# Patient Record
Sex: Male | Born: 1935 | Race: White | Hispanic: No | Marital: Married | Smoking: Never smoker
Health system: Southern US, Community
[De-identification: ages and names within clinical notes are randomized; demographics above are authoritative.]

## PROBLEM LIST (undated history)

## (undated) DIAGNOSIS — M1990 Unspecified osteoarthritis, unspecified site: Secondary | ICD-10-CM

## (undated) DIAGNOSIS — I10 Essential (primary) hypertension: Secondary | ICD-10-CM

## (undated) DIAGNOSIS — E785 Hyperlipidemia, unspecified: Secondary | ICD-10-CM

## (undated) HISTORY — PX: APPENDECTOMY: SHX54

## (undated) HISTORY — PX: JOINT REPLACEMENT: SHX530

## (undated) HISTORY — DX: Essential (primary) hypertension: I10

## (undated) HISTORY — PX: HERNIA REPAIR: SHX51

## (undated) HISTORY — DX: Hyperlipidemia, unspecified: E78.5

---

## 1999-06-29 ENCOUNTER — Encounter: Payer: Self-pay | Admitting: Orthopedic Surgery

## 1999-07-06 ENCOUNTER — Encounter: Payer: Self-pay | Admitting: Orthopedic Surgery

## 1999-07-06 ENCOUNTER — Inpatient Hospital Stay (HOSPITAL_COMMUNITY): Admission: RE | Admit: 1999-07-06 | Discharge: 1999-07-10 | Payer: Self-pay | Admitting: Orthopedic Surgery

## 2000-05-10 ENCOUNTER — Observation Stay (HOSPITAL_COMMUNITY): Admission: RE | Admit: 2000-05-10 | Discharge: 2000-05-11 | Payer: Self-pay

## 2000-06-26 ENCOUNTER — Encounter: Payer: Self-pay | Admitting: Orthopedic Surgery

## 2000-07-03 ENCOUNTER — Encounter: Payer: Self-pay | Admitting: Orthopedic Surgery

## 2000-07-03 ENCOUNTER — Inpatient Hospital Stay (HOSPITAL_COMMUNITY): Admission: RE | Admit: 2000-07-03 | Discharge: 2000-07-07 | Payer: Self-pay | Admitting: Orthopedic Surgery

## 2005-02-02 ENCOUNTER — Ambulatory Visit: Payer: Self-pay | Admitting: Gastroenterology

## 2006-01-30 ENCOUNTER — Ambulatory Visit: Payer: Self-pay | Admitting: Gastroenterology

## 2011-05-21 DIAGNOSIS — M1990 Unspecified osteoarthritis, unspecified site: Secondary | ICD-10-CM | POA: Diagnosis not present

## 2011-06-21 DIAGNOSIS — M5137 Other intervertebral disc degeneration, lumbosacral region: Secondary | ICD-10-CM | POA: Diagnosis not present

## 2011-06-25 DIAGNOSIS — I10 Essential (primary) hypertension: Secondary | ICD-10-CM | POA: Diagnosis not present

## 2011-06-25 DIAGNOSIS — M1990 Unspecified osteoarthritis, unspecified site: Secondary | ICD-10-CM | POA: Diagnosis not present

## 2011-09-13 DIAGNOSIS — M5137 Other intervertebral disc degeneration, lumbosacral region: Secondary | ICD-10-CM | POA: Diagnosis not present

## 2011-12-19 DIAGNOSIS — L570 Actinic keratosis: Secondary | ICD-10-CM | POA: Diagnosis not present

## 2011-12-19 DIAGNOSIS — L821 Other seborrheic keratosis: Secondary | ICD-10-CM | POA: Diagnosis not present

## 2012-01-01 DIAGNOSIS — H2510 Age-related nuclear cataract, unspecified eye: Secondary | ICD-10-CM | POA: Diagnosis not present

## 2012-01-03 DIAGNOSIS — M5137 Other intervertebral disc degeneration, lumbosacral region: Secondary | ICD-10-CM | POA: Diagnosis not present

## 2012-01-09 DIAGNOSIS — E119 Type 2 diabetes mellitus without complications: Secondary | ICD-10-CM | POA: Diagnosis not present

## 2012-01-09 DIAGNOSIS — Z0000 Encounter for general adult medical examination without abnormal findings: Secondary | ICD-10-CM | POA: Diagnosis not present

## 2012-01-09 DIAGNOSIS — I10 Essential (primary) hypertension: Secondary | ICD-10-CM | POA: Diagnosis not present

## 2012-01-09 DIAGNOSIS — K219 Gastro-esophageal reflux disease without esophagitis: Secondary | ICD-10-CM | POA: Diagnosis not present

## 2012-01-09 DIAGNOSIS — E7800 Pure hypercholesterolemia, unspecified: Secondary | ICD-10-CM | POA: Diagnosis not present

## 2012-01-13 ENCOUNTER — Ambulatory Visit (INDEPENDENT_AMBULATORY_CARE_PROVIDER_SITE_OTHER): Payer: Medicare Other | Admitting: Family Medicine

## 2012-01-13 VITALS — BP 132/70 | HR 63 | Temp 97.8°F | Resp 18 | Wt 187.0 lb

## 2012-01-13 DIAGNOSIS — S0180XA Unspecified open wound of other part of head, initial encounter: Secondary | ICD-10-CM | POA: Diagnosis not present

## 2012-01-13 DIAGNOSIS — H5710 Ocular pain, unspecified eye: Secondary | ICD-10-CM

## 2012-01-13 MED ORDER — CEPHALEXIN 500 MG PO CAPS: 500.0000 mg | ORAL_CAPSULE | Freq: Two times a day (BID) | ORAL | Status: DC

## 2012-01-13 NOTE — Patient Instructions (Addendum)
Check with your doctor tomorrow as to when the last tetanus vaccine was received.  If not within the past 5 years, this needs to be given for your current injury.  Because the wound was open for a prolonged time, we are starting an antibiotic for the next week to lessen the chance of infection.  See precautions below for head injury and the wound. Return to the clinic or go to the nearest emergency room if any of your symptoms worsen or new symptoms occur.      HEAD INJURY If any of the following occur notify your physician or go to the Hospital Emergency Department: . Increased drowsiness, stupor or loss of consciousness . Restlessness or convulsions (fits) . Paralysis in arms or legs . Temperature above 100 F . Vomiting . Severe headache . Blood or clear fluid dripping from the nose or ears . Stiffness of the neck . Dizziness or blurred vision . Pulsating pain in the eye . Unequal pupils of eye . Personality changes . Any other unusual symptoms PRECAUTIONS . Keep head elevated at all times for the first 24 hours (Elevate mattress if pillow is ineffective) . Do not take tranquilizers, sedatives, narcotics or alcohol . Avoid aspirin. Use only acetaminophen (e.g. Tylenol) or ibuprofen (e.g. Advil) for relief of pain. Follow directions on the bottle for dosage. . Use ice packs for comfort Have parent, spouse, or friend check on you once tonight  MEDICATIONS Use medications only as directed by your physician  WOUND CARE Please return in 5-7 days to have your stitches/staples removed or sooner if you have concerns. Marland Kitchen Keep area clean and dry for 24 hours. Do not remove bandage, if applied. . After 24 hours, remove bandage and wash wound gently with mild soap and warm water. Reapply a new bandage after cleaning wound, if directed. . Continue daily cleansing with soap and water until stitches/staples are removed. . Do not apply any ointments or creams to the wound while  stitches/staples are in place, as this may cause delayed healing. . Notify the office if you experience any of the following signs of infection: Swelling, redness, pus drainage, streaking, fever >101.0 F . Notify the office if you experience excessive bleeding that does not stop after 15-20 minutes of constant, firm pressure.

## 2012-01-13 NOTE — Progress Notes (Signed)
Subjective:    Patient ID: Vernon Walton, male    DOB: 08/05/1918, 78 y.o.   MRN: [id number]  HPI Vernon Walton is a 78 y.o. male Reaching for phone at 8:30 or 9 pm last night.  while trying to some exercises - foot slipped on rug - hit L forehead on bedside table - wound on L forehead.  Wife saw today, advised to have checked.  No headache, no pain in area, no vision difficulty, no N/V or lightheadedness.  No blood thinners. No loc at time of injury  Tx: bandage only.    Review of Systems  Eyes: Negative for photophobia, pain and visual disturbance.  Skin: Positive for wound.  Neurological: Negative for dizziness, facial asymmetry, speech difficulty, weakness, light-headedness, numbness and headaches.       Objective:   Physical Exam  Constitutional: He appears well-developed and well-nourished.  HENT:  Head: Macrocephalic.    Right Ear: External ear normal.  Left Ear: External ear normal.  Pulmonary/Chest: Effort normal.  Skin: Skin is warm and dry.  Psychiatric: He has a normal mood and affect. His behavior is normal.       Assessment & Plan:  Vernon Walton is a 78 y.o. male  Wound - face with prolonged time to repair.  Repaired as above.  Wound care and head injury precautions reviewed and h/o given. Keflex 500mg  BID rx for 1 week.  Has appt with Dr. Renne Crigler tomorrow. He will check on tetanus status then - if has not had in 5 years - will need updated.   Rtc/wound precautions discussed. Head injury precautions discussed.   Patient Instructions  Check with your doctor tomorrow as to when the last tetanus vaccine was received.  If not within the past 5 years, this needs to be given for your current injury.  Because the wound was open for a prolonged time, we are starting an antibiotic for the next week to lessen the chance of infection.  See precautions below for head injury and the wound. Return to the clinic or go to the nearest emergency room if any of your symptoms  worsen or new symptoms occur.      HEAD INJURY If any of the following occur notify your physician or go to the Hospital Emergency Department: . Increased drowsiness, stupor or loss of consciousness . Restlessness or convulsions (fits) . Paralysis in arms or legs . Temperature above 100 F . Vomiting . Severe headache . Blood or clear fluid dripping from the nose or ears . Stiffness of the neck . Dizziness or blurred vision . Pulsating pain in the eye . Unequal pupils of eye . Personality changes . Any other unusual symptoms PRECAUTIONS . Keep head elevated at all times for the first 24 hours (Elevate mattress if pillow is ineffective) . Do not take tranquilizers, sedatives, narcotics or alcohol . Avoid aspirin. Use only acetaminophen (e.g. Tylenol) or ibuprofen (e.g. Advil) for relief of pain. Follow directions on the bottle for dosage. . Use ice packs for comfort Have parent, spouse, or friend check on you once tonight  MEDICATIONS Use medications only as directed by your physician  WOUND CARE Please return in 5-7 days to have your stitches/staples removed or sooner if you have concerns. Marland Kitchen Keep area clean and dry for 24 hours. Do not remove bandage, if applied. . After 24 hours, remove bandage and wash wound gently with mild soap and warm water. Reapply a new bandage after cleaning wound, if directed. . Continue daily  cleansing with soap and water until stitches/staples are removed. . Do not apply any ointments or creams to the wound while stitches/staples are in place, as this may cause delayed healing. . Notify the office if you experience any of the following signs of infection: Swelling, redness, pus drainage, streaking, fever >101.0 F . Notify the office if you experience excessive bleeding that does not stop after 15-20 minutes of constant, firm pressure.

## 2012-01-13 NOTE — Progress Notes (Signed)
Verbal consent obtained from the patient.  Local anesthesia with 3cc Lidocaine 2% with epinephrine.  Wound scrubbed with soap and water and rinsed.  Wound closed with 4 5-0 Ethilon simple interrupted sutures.  Wound cleansed and dressed.

## 2012-01-14 DIAGNOSIS — I10 Essential (primary) hypertension: Secondary | ICD-10-CM | POA: Diagnosis not present

## 2012-01-14 DIAGNOSIS — Z23 Encounter for immunization: Secondary | ICD-10-CM | POA: Diagnosis not present

## 2012-01-14 DIAGNOSIS — IMO0002 Reserved for concepts with insufficient information to code with codable children: Secondary | ICD-10-CM | POA: Diagnosis not present

## 2012-01-14 DIAGNOSIS — E7800 Pure hypercholesterolemia, unspecified: Secondary | ICD-10-CM | POA: Diagnosis not present

## 2012-01-14 DIAGNOSIS — Z0000 Encounter for general adult medical examination without abnormal findings: Secondary | ICD-10-CM | POA: Diagnosis not present

## 2012-01-18 ENCOUNTER — Ambulatory Visit (INDEPENDENT_AMBULATORY_CARE_PROVIDER_SITE_OTHER): Payer: Medicare Other | Admitting: Family Medicine

## 2012-01-18 VITALS — BP 132/74 | HR 57 | Temp 98.1°F | Resp 16 | Ht 64.0 in | Wt 188.0 lb

## 2012-01-18 DIAGNOSIS — S0180XA Unspecified open wound of other part of head, initial encounter: Secondary | ICD-10-CM

## 2012-01-18 NOTE — Patient Instructions (Signed)
Return prn 

## 2012-01-18 NOTE — Progress Notes (Signed)
Subjective: Patient here for stitch removal moved. No complaints.  Objective: 4 sutures removed without difficulty. Wound looks good.  Assessment: Include resolving  Plan: Return when necessary

## 2012-02-11 DIAGNOSIS — I10 Essential (primary) hypertension: Secondary | ICD-10-CM | POA: Diagnosis not present

## 2012-03-04 DIAGNOSIS — Z96659 Presence of unspecified artificial knee joint: Secondary | ICD-10-CM | POA: Diagnosis not present

## 2012-03-14 DIAGNOSIS — G894 Chronic pain syndrome: Secondary | ICD-10-CM | POA: Diagnosis not present

## 2012-04-11 DIAGNOSIS — I10 Essential (primary) hypertension: Secondary | ICD-10-CM | POA: Diagnosis not present

## 2012-04-11 DIAGNOSIS — K5900 Constipation, unspecified: Secondary | ICD-10-CM | POA: Diagnosis not present

## 2012-07-09 DIAGNOSIS — I10 Essential (primary) hypertension: Secondary | ICD-10-CM | POA: Diagnosis not present

## 2012-07-09 DIAGNOSIS — M159 Polyosteoarthritis, unspecified: Secondary | ICD-10-CM | POA: Diagnosis not present

## 2012-10-08 DIAGNOSIS — M25579 Pain in unspecified ankle and joints of unspecified foot: Secondary | ICD-10-CM | POA: Diagnosis not present

## 2013-01-05 DIAGNOSIS — M545 Low back pain: Secondary | ICD-10-CM | POA: Diagnosis not present

## 2013-01-09 DIAGNOSIS — E7800 Pure hypercholesterolemia, unspecified: Secondary | ICD-10-CM | POA: Diagnosis not present

## 2013-01-09 DIAGNOSIS — Z0000 Encounter for general adult medical examination without abnormal findings: Secondary | ICD-10-CM | POA: Diagnosis not present

## 2013-01-09 DIAGNOSIS — E119 Type 2 diabetes mellitus without complications: Secondary | ICD-10-CM | POA: Diagnosis not present

## 2013-01-09 DIAGNOSIS — I10 Essential (primary) hypertension: Secondary | ICD-10-CM | POA: Diagnosis not present

## 2013-01-14 DIAGNOSIS — Z23 Encounter for immunization: Secondary | ICD-10-CM | POA: Diagnosis not present

## 2013-01-14 DIAGNOSIS — M545 Low back pain: Secondary | ICD-10-CM | POA: Diagnosis not present

## 2013-02-11 DIAGNOSIS — E7800 Pure hypercholesterolemia, unspecified: Secondary | ICD-10-CM | POA: Diagnosis not present

## 2013-02-11 DIAGNOSIS — M4800 Spinal stenosis, site unspecified: Secondary | ICD-10-CM | POA: Diagnosis not present

## 2013-02-11 DIAGNOSIS — I10 Essential (primary) hypertension: Secondary | ICD-10-CM | POA: Diagnosis not present

## 2013-02-23 DIAGNOSIS — M545 Low back pain: Secondary | ICD-10-CM | POA: Diagnosis not present

## 2013-03-10 DIAGNOSIS — M48061 Spinal stenosis, lumbar region without neurogenic claudication: Secondary | ICD-10-CM | POA: Diagnosis not present

## 2013-03-18 DIAGNOSIS — I10 Essential (primary) hypertension: Secondary | ICD-10-CM | POA: Diagnosis not present

## 2013-04-21 DIAGNOSIS — Z79899 Other long term (current) drug therapy: Secondary | ICD-10-CM | POA: Diagnosis not present

## 2013-04-21 DIAGNOSIS — G894 Chronic pain syndrome: Secondary | ICD-10-CM | POA: Diagnosis not present

## 2013-04-21 DIAGNOSIS — M545 Low back pain: Secondary | ICD-10-CM | POA: Diagnosis not present

## 2013-07-13 DIAGNOSIS — H2510 Age-related nuclear cataract, unspecified eye: Secondary | ICD-10-CM | POA: Diagnosis not present

## 2013-08-19 DIAGNOSIS — M545 Low back pain, unspecified: Secondary | ICD-10-CM | POA: Diagnosis not present

## 2013-10-09 DIAGNOSIS — Z96659 Presence of unspecified artificial knee joint: Secondary | ICD-10-CM | POA: Diagnosis not present

## 2013-12-09 DIAGNOSIS — G894 Chronic pain syndrome: Secondary | ICD-10-CM | POA: Diagnosis not present

## 2013-12-30 DIAGNOSIS — M545 Low back pain, unspecified: Secondary | ICD-10-CM | POA: Diagnosis not present

## 2014-01-15 DIAGNOSIS — G894 Chronic pain syndrome: Secondary | ICD-10-CM | POA: Diagnosis not present

## 2014-01-15 DIAGNOSIS — Z79899 Other long term (current) drug therapy: Secondary | ICD-10-CM | POA: Diagnosis not present

## 2014-01-27 DIAGNOSIS — L821 Other seborrheic keratosis: Secondary | ICD-10-CM | POA: Diagnosis not present

## 2014-01-27 DIAGNOSIS — L570 Actinic keratosis: Secondary | ICD-10-CM | POA: Diagnosis not present

## 2014-02-08 DIAGNOSIS — I10 Essential (primary) hypertension: Secondary | ICD-10-CM | POA: Diagnosis not present

## 2014-02-08 DIAGNOSIS — Z23 Encounter for immunization: Secondary | ICD-10-CM | POA: Diagnosis not present

## 2014-02-08 DIAGNOSIS — E119 Type 2 diabetes mellitus without complications: Secondary | ICD-10-CM | POA: Diagnosis not present

## 2014-02-08 DIAGNOSIS — Z0000 Encounter for general adult medical examination without abnormal findings: Secondary | ICD-10-CM | POA: Diagnosis not present

## 2014-02-08 DIAGNOSIS — E780 Pure hypercholesterolemia: Secondary | ICD-10-CM | POA: Diagnosis not present

## 2014-02-11 DIAGNOSIS — B351 Tinea unguium: Secondary | ICD-10-CM | POA: Diagnosis not present

## 2014-02-11 DIAGNOSIS — G629 Polyneuropathy, unspecified: Secondary | ICD-10-CM | POA: Diagnosis not present

## 2014-02-11 DIAGNOSIS — L84 Corns and callosities: Secondary | ICD-10-CM | POA: Diagnosis not present

## 2014-02-11 DIAGNOSIS — D519 Vitamin B12 deficiency anemia, unspecified: Secondary | ICD-10-CM | POA: Diagnosis not present

## 2014-02-23 ENCOUNTER — Ambulatory Visit (INDEPENDENT_AMBULATORY_CARE_PROVIDER_SITE_OTHER): Payer: Medicare Other

## 2014-02-23 VITALS — BP 143/69 | HR 62 | Resp 15 | Ht 65.0 in | Wt 173.0 lb

## 2014-02-23 DIAGNOSIS — L84 Corns and callosities: Secondary | ICD-10-CM | POA: Diagnosis not present

## 2014-02-23 DIAGNOSIS — M19071 Primary osteoarthritis, right ankle and foot: Secondary | ICD-10-CM

## 2014-02-23 DIAGNOSIS — M2041 Other hammer toe(s) (acquired), right foot: Secondary | ICD-10-CM | POA: Diagnosis not present

## 2014-02-23 DIAGNOSIS — R269 Unspecified abnormalities of gait and mobility: Secondary | ICD-10-CM

## 2014-02-23 DIAGNOSIS — Q828 Other specified congenital malformations of skin: Secondary | ICD-10-CM | POA: Diagnosis not present

## 2014-02-23 DIAGNOSIS — M2031 Hallux varus (acquired), right foot: Secondary | ICD-10-CM

## 2014-02-23 NOTE — Patient Instructions (Signed)
Corns and Calluses Corns are small areas of thickened skin that usually occur on the top, sides, or tip of a toe. They contain a cone-shaped core with a point that can press on a nerve below. This causes pain. Calluses are areas of thickened skin that usually develop on hands, fingers, palms, soles of the feet, and heels. These are areas that experience frequent friction or pressure. CAUSES  Corns are usually the result of rubbing (friction) or pressure from shoes that are too tight or do not fit properly. Calluses are caused by repeated friction and pressure on the affected areas. SYMPTOMS  A hard growth on the skin.  Pain or tenderness under the skin.  Sometimes, redness and swelling.  Increased discomfort while wearing tight-fitting shoes. DIAGNOSIS  Your caregiver can usually tell what the problem is by doing a physical exam. TREATMENT  Removing the cause of the friction or pressure is usually the only treatment needed. However, sometimes medicines can be used to help soften the hardened, thickened areas. These medicines include salicylic acid plasters and 12% ammonium lactate lotion. These medicines should only be used under the direction of your caregiver. HOME CARE INSTRUCTIONS   Try to remove pressure from the affected area.  You may wear donut-shaped corn pads to protect your skin.  You may use a pumice stone or nonmetallic nail file to gently reduce the thickness of a corn.  Wear properly fitted footwear.  If you have calluses on the hands, wear gloves during activities that cause friction.  If you have diabetes, you should regularly examine your feet. Tell your caregiver if you notice any problems with your feet. SEEK IMMEDIATE MEDICAL CARE IF:   You have increased pain, swelling, redness, or warmth in the affected area.  Your corn or callus starts to drain fluid or bleeds.  You are not getting better, even with treatment. Document Released: 01/14/2004 Document  Revised: 07/02/2011 Document Reviewed: 12/05/2010 [employer] Patient Information 2015 [employer]. This information is not intended to replace advice given to you by your health care provider. Make sure you discuss any questions you have with your health care provider.  

## 2014-02-23 NOTE — Progress Notes (Signed)
   Subjective:    Patient ID: Vernon HawkingARMAND [name] Sr., male    DOB: 20-Dec-1918, 80 y.o.   MRN: [id number]  HPI Comments: N corn L right 1st toe tip D and O years C hard painful A walking and shoe wear T referred by Dr. Renne CriglerPharr     Review of Systems  HENT: Positive for hearing loss.   All other systems reviewed and are negative.      Objective:   Physical Exam 80 year old white male well-developed well-nourished oriented 3 presents this time with a complaint of the lesion distal tip of his right hallux. 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 Lower extremity objective findings reveal vascular status to be intact with pedal pulses palpable DP and PT plus one over 4 bilateral capillary refill time 3 seconds epicritic and proprioceptive sensations appear to be intact and symmetric bilateral there is normal plantar response DTRs not elicited dermatologically skin color pigment normal hair growth absent nails criptotic friable brittle dystrophic and tender both on palpation and with enclosed shoe wear patient also has a single keratotic lesion distal plantar tuft of the right hallux due to hallux malleus deformity. No open wounds no ulcers no secondary infections no x-rays taken at this time patient is to generalized weakness and stiffness in both ankles to his walking especially on uneven surface or train at times. He did request an ankle brace of some sort he's tried over-the-counter braces which have been unsuccessful looking for some abnormal more of a rigid or stable brace may looking to see if we get with gait stabilizer brace or Richie type prefab brace to help stabilize ankles and stabilizes on steady gait.       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 Patient is been wearing a thin comfort high top boot wears a size 11 shoe significant distress other than ankle instability and keratotic lesion which really may require future palliative debridement next  Alvan Dameichard Zacharia Sowles DPM

## 2014-03-04 ENCOUNTER — Ambulatory Visit (INDEPENDENT_AMBULATORY_CARE_PROVIDER_SITE_OTHER): Payer: Medicare Other

## 2014-03-04 ENCOUNTER — Telehealth: Payer: Self-pay | Admitting: *Deleted

## 2014-03-04 DIAGNOSIS — R269 Unspecified abnormalities of gait and mobility: Secondary | ICD-10-CM

## 2014-03-04 NOTE — Telephone Encounter (Signed)
I called yesterday regarding the ankle support Dr. Ralene CorkSikora was going to have for me.  I'd appreciate you getting back to me and let me know if it's ready.  Thank you.  [facility] and [facility] said the brace was here. She said it was okay to put him on the nurse's schedule to pick it up. I called and informed the patient.  He asked, "Can I come in today?"  I told him I would have to transfer him to a scheduler.

## 2014-03-04 NOTE — Telephone Encounter (Signed)
Good Morning, I'm still calling about the ankle wrap.  I haven't heard from you.  This will be my last call.  I won't call you anymore because it's just a waste of time on my part.  Thank you.  Already taken care of today earlier.

## 2014-03-05 NOTE — Addendum Note (Signed)
Addended by: Lottie RaterPREVETTE, Emmalou Hunger E on: 03/05/2014 02:17 PM   Modules accepted: Level of Service

## 2014-04-22 DIAGNOSIS — M47816 Spondylosis without myelopathy or radiculopathy, lumbar region: Secondary | ICD-10-CM | POA: Diagnosis not present

## 2014-06-23 DIAGNOSIS — M5416 Radiculopathy, lumbar region: Secondary | ICD-10-CM | POA: Diagnosis not present

## 2014-07-20 DIAGNOSIS — H2513 Age-related nuclear cataract, bilateral: Secondary | ICD-10-CM | POA: Diagnosis not present

## 2014-07-30 DIAGNOSIS — Z79891 Long term (current) use of opiate analgesic: Secondary | ICD-10-CM | POA: Diagnosis not present

## 2014-07-30 DIAGNOSIS — M5416 Radiculopathy, lumbar region: Secondary | ICD-10-CM | POA: Diagnosis not present

## 2014-07-30 DIAGNOSIS — G894 Chronic pain syndrome: Secondary | ICD-10-CM | POA: Diagnosis not present

## 2014-08-25 DIAGNOSIS — M5416 Radiculopathy, lumbar region: Secondary | ICD-10-CM | POA: Diagnosis not present

## 2014-11-25 DIAGNOSIS — Z79891 Long term (current) use of opiate analgesic: Secondary | ICD-10-CM | POA: Diagnosis not present

## 2014-11-25 DIAGNOSIS — M5416 Radiculopathy, lumbar region: Secondary | ICD-10-CM | POA: Diagnosis not present

## 2014-11-25 DIAGNOSIS — G894 Chronic pain syndrome: Secondary | ICD-10-CM | POA: Diagnosis not present

## 2014-12-22 DIAGNOSIS — M5416 Radiculopathy, lumbar region: Secondary | ICD-10-CM | POA: Diagnosis not present

## 2015-01-17 DIAGNOSIS — G894 Chronic pain syndrome: Secondary | ICD-10-CM | POA: Diagnosis not present

## 2015-01-20 DIAGNOSIS — G894 Chronic pain syndrome: Secondary | ICD-10-CM | POA: Diagnosis not present

## 2015-01-20 DIAGNOSIS — M5416 Radiculopathy, lumbar region: Secondary | ICD-10-CM | POA: Diagnosis not present

## 2015-01-20 DIAGNOSIS — Z79891 Long term (current) use of opiate analgesic: Secondary | ICD-10-CM | POA: Diagnosis not present

## 2015-02-16 DIAGNOSIS — Z23 Encounter for immunization: Secondary | ICD-10-CM | POA: Diagnosis not present

## 2015-03-24 DIAGNOSIS — L570 Actinic keratosis: Secondary | ICD-10-CM | POA: Diagnosis not present

## 2015-04-14 DIAGNOSIS — M5416 Radiculopathy, lumbar region: Secondary | ICD-10-CM | POA: Diagnosis not present

## 2015-04-14 DIAGNOSIS — Z79891 Long term (current) use of opiate analgesic: Secondary | ICD-10-CM | POA: Diagnosis not present

## 2015-07-01 DIAGNOSIS — K219 Gastro-esophageal reflux disease without esophagitis: Secondary | ICD-10-CM | POA: Diagnosis not present

## 2015-07-01 DIAGNOSIS — I10 Essential (primary) hypertension: Secondary | ICD-10-CM | POA: Diagnosis not present

## 2015-07-07 DIAGNOSIS — Z0000 Encounter for general adult medical examination without abnormal findings: Secondary | ICD-10-CM | POA: Diagnosis not present

## 2015-07-14 DIAGNOSIS — I10 Essential (primary) hypertension: Secondary | ICD-10-CM | POA: Diagnosis not present

## 2015-07-14 DIAGNOSIS — K219 Gastro-esophageal reflux disease without esophagitis: Secondary | ICD-10-CM | POA: Diagnosis not present

## 2015-07-14 DIAGNOSIS — N400 Enlarged prostate without lower urinary tract symptoms: Secondary | ICD-10-CM | POA: Diagnosis not present

## 2015-07-14 DIAGNOSIS — M159 Polyosteoarthritis, unspecified: Secondary | ICD-10-CM | POA: Diagnosis not present

## 2015-07-20 DIAGNOSIS — H2513 Age-related nuclear cataract, bilateral: Secondary | ICD-10-CM | POA: Diagnosis not present

## 2015-07-20 DIAGNOSIS — Z0100 Encounter for examination of eyes and vision without abnormal findings: Secondary | ICD-10-CM | POA: Diagnosis not present

## 2015-07-20 DIAGNOSIS — H25013 Cortical age-related cataract, bilateral: Secondary | ICD-10-CM | POA: Diagnosis not present

## 2015-08-11 DIAGNOSIS — M5416 Radiculopathy, lumbar region: Secondary | ICD-10-CM | POA: Diagnosis not present

## 2015-08-11 DIAGNOSIS — Z79891 Long term (current) use of opiate analgesic: Secondary | ICD-10-CM | POA: Diagnosis not present

## 2015-08-11 DIAGNOSIS — G894 Chronic pain syndrome: Secondary | ICD-10-CM | POA: Diagnosis not present

## 2016-01-06 DIAGNOSIS — M5416 Radiculopathy, lumbar region: Secondary | ICD-10-CM | POA: Diagnosis not present

## 2016-01-06 DIAGNOSIS — G894 Chronic pain syndrome: Secondary | ICD-10-CM | POA: Diagnosis not present

## 2016-01-06 DIAGNOSIS — Z79891 Long term (current) use of opiate analgesic: Secondary | ICD-10-CM | POA: Diagnosis not present

## 2016-02-28 DIAGNOSIS — E119 Type 2 diabetes mellitus without complications: Secondary | ICD-10-CM | POA: Diagnosis not present

## 2016-02-28 DIAGNOSIS — Z23 Encounter for immunization: Secondary | ICD-10-CM | POA: Diagnosis not present

## 2016-07-12 DIAGNOSIS — G894 Chronic pain syndrome: Secondary | ICD-10-CM | POA: Diagnosis not present

## 2016-07-12 DIAGNOSIS — Z79891 Long term (current) use of opiate analgesic: Secondary | ICD-10-CM | POA: Diagnosis not present

## 2016-07-12 DIAGNOSIS — M5416 Radiculopathy, lumbar region: Secondary | ICD-10-CM | POA: Diagnosis not present

## 2016-07-25 DIAGNOSIS — H52203 Unspecified astigmatism, bilateral: Secondary | ICD-10-CM | POA: Diagnosis not present

## 2016-07-25 DIAGNOSIS — H5203 Hypermetropia, bilateral: Secondary | ICD-10-CM | POA: Diagnosis not present

## 2016-07-25 DIAGNOSIS — H2513 Age-related nuclear cataract, bilateral: Secondary | ICD-10-CM | POA: Diagnosis not present

## 2016-08-10 DIAGNOSIS — M19041 Primary osteoarthritis, right hand: Secondary | ICD-10-CM | POA: Diagnosis not present

## 2016-08-10 DIAGNOSIS — M19042 Primary osteoarthritis, left hand: Secondary | ICD-10-CM | POA: Diagnosis not present

## 2016-08-28 DIAGNOSIS — M545 Low back pain: Secondary | ICD-10-CM | POA: Diagnosis not present

## 2016-08-28 DIAGNOSIS — E119 Type 2 diabetes mellitus without complications: Secondary | ICD-10-CM | POA: Diagnosis not present

## 2016-08-28 DIAGNOSIS — G8929 Other chronic pain: Secondary | ICD-10-CM | POA: Diagnosis not present

## 2016-09-01 ENCOUNTER — Encounter (HOSPITAL_COMMUNITY): Payer: Self-pay | Admitting: Emergency Medicine

## 2016-09-01 ENCOUNTER — Inpatient Hospital Stay (HOSPITAL_COMMUNITY)
Admission: EM | Admit: 2016-09-01 | Discharge: 2016-09-03 | DRG: 310 | Disposition: A | Discharge status: Home / Self Care | Payer: Medicare Other | Attending: Internal Medicine | Admitting: Internal Medicine

## 2016-09-01 ENCOUNTER — Emergency Department (HOSPITAL_COMMUNITY): Payer: Medicare Other

## 2016-09-01 DIAGNOSIS — Z96653 Presence of artificial knee joint, bilateral: Secondary | ICD-10-CM | POA: Diagnosis not present

## 2016-09-01 DIAGNOSIS — Z79899 Other long term (current) drug therapy: Secondary | ICD-10-CM

## 2016-09-01 DIAGNOSIS — D649 Anemia, unspecified: Secondary | ICD-10-CM | POA: Diagnosis not present

## 2016-09-01 DIAGNOSIS — R001 Bradycardia, unspecified: Secondary | ICD-10-CM | POA: Diagnosis present

## 2016-09-01 DIAGNOSIS — I440 Atrioventricular block, first degree: Principal | ICD-10-CM | POA: Diagnosis present

## 2016-09-01 DIAGNOSIS — R404 Transient alteration of awareness: Secondary | ICD-10-CM | POA: Diagnosis not present

## 2016-09-01 DIAGNOSIS — S0003XA Contusion of scalp, initial encounter: Secondary | ICD-10-CM | POA: Diagnosis not present

## 2016-09-01 DIAGNOSIS — R55 Syncope and collapse: Secondary | ICD-10-CM | POA: Diagnosis not present

## 2016-09-01 DIAGNOSIS — I10 Essential (primary) hypertension: Secondary | ICD-10-CM | POA: Diagnosis present

## 2016-09-01 DIAGNOSIS — E785 Hyperlipidemia, unspecified: Secondary | ICD-10-CM | POA: Diagnosis not present

## 2016-09-01 DIAGNOSIS — S0083XA Contusion of other part of head, initial encounter: Secondary | ICD-10-CM

## 2016-09-01 HISTORY — DX: Unspecified osteoarthritis, unspecified site: M19.90

## 2016-09-01 LAB — CBC
HCT: 34.3 % — ABNORMAL LOW (ref 39.0–52.0)
Hemoglobin: 11.2 g/dL — ABNORMAL LOW (ref 13.0–17.0)
MCH: 32 pg (ref 26.0–34.0)
MCHC: 32.7 g/dL (ref 30.0–36.0)
MCV: 98 fL (ref 78.0–100.0)
Platelets: 178 10*3/uL (ref 150–400)
RBC: 3.5 MIL/uL — ABNORMAL LOW (ref 4.22–5.81)
RDW: 12.5 % (ref 11.5–15.5)
WBC: 6.5 10*3/uL (ref 4.0–10.5)

## 2016-09-01 LAB — BASIC METABOLIC PANEL
Anion gap: 7 (ref 5–15)
BUN: 31 mg/dL — AB (ref 6–20)
CO2: 27 mmol/L (ref 22–32)
Calcium: 9.6 mg/dL (ref 8.9–10.3)
Chloride: 104 mmol/L (ref 101–111)
Creatinine, Ser: 1.1 mg/dL (ref 0.61–1.24)
GFR calc Af Amer: >60 mL/min (ref >60)
GFR, EST NON AFRICAN AMERICAN: 55 mL/min — AB (ref >60)
Glucose, Bld: 106 mg/dL — ABNORMAL HIGH (ref 65–99)
POTASSIUM: 4 mmol/L (ref 3.5–5.1)
SODIUM: 138 mmol/L (ref 135–145)

## 2016-09-01 LAB — URINALYSIS, ROUTINE W REFLEX MICROSCOPIC
BILIRUBIN URINE: NEGATIVE
Glucose, UA: NEGATIVE mg/dL
Hgb urine dipstick: NEGATIVE
Ketones, ur: NEGATIVE mg/dL
LEUKOCYTES UA: NEGATIVE
NITRITE: NEGATIVE
PH: 6 (ref 5.0–8.0)
Protein, ur: NEGATIVE mg/dL
SPECIFIC GRAVITY, URINE: 1.012 (ref 1.005–1.030)

## 2016-09-01 LAB — IRON AND TIBC
IRON: 89 ug/dL (ref 45–182)
SATURATION RATIOS: 29 % (ref 17.9–39.5)
TIBC: 304 ug/dL (ref 250–450)
UIBC: 215 ug/dL

## 2016-09-01 LAB — I-STAT TROPONIN, ED: TROPONIN I, POC: 0.01 ng/mL (ref 0.00–0.08)

## 2016-09-01 LAB — RETICULOCYTES
RBC.: 3.73 MIL/uL — ABNORMAL LOW (ref 4.22–5.81)
RETIC CT PCT: 1.3 % (ref 0.4–3.1)
Retic Count, Absolute: 48.5 10*3/uL (ref 19.0–186.0)

## 2016-09-01 LAB — DIFFERENTIAL
BASOS ABS: 0.1 10*3/uL (ref 0.0–0.1)
Basophils Relative: 1 %
Eosinophils Absolute: 0.2 10*3/uL (ref 0.0–0.7)
Eosinophils Relative: 3 %
LYMPHS ABS: 1.3 10*3/uL (ref 0.7–4.0)
LYMPHS PCT: 20 %
MONO ABS: 0.6 10*3/uL (ref 0.1–1.0)
MONOS PCT: 9 %
NEUTROS ABS: 4.4 10*3/uL (ref 1.7–7.7)
Neutrophils Relative %: 67 %

## 2016-09-01 LAB — FERRITIN: Ferritin: 131 ng/mL (ref 24–336)

## 2016-09-01 LAB — TROPONIN I

## 2016-09-01 LAB — VITAMIN B12: Vitamin B-12: 3251 pg/mL — ABNORMAL HIGH (ref 180–914)

## 2016-09-01 LAB — CBG MONITORING, ED: Glucose-Capillary: 93 mg/dL (ref 65–99)

## 2016-09-01 LAB — TSH: TSH: 1.926 u[IU]/mL (ref 0.350–4.500)

## 2016-09-01 LAB — FOLATE: FOLATE: 39.6 ng/mL (ref >5.9)

## 2016-09-01 MED ORDER — PANTOPRAZOLE SODIUM 40 MG PO TBEC
40.0000 mg | DELAYED_RELEASE_TABLET | Freq: Every day | ORAL | Status: DC
  Administered 2016-09-01 – 2016-09-03 (×3): 40 mg via ORAL
  Filled 2016-09-01 (×3): qty 1

## 2016-09-01 MED ORDER — ACETAMINOPHEN 650 MG RE SUPP: 650.0000 mg | Freq: Four times a day (QID) | RECTAL | Status: DC | PRN

## 2016-09-01 MED ORDER — SODIUM CHLORIDE 0.9% FLUSH
3.0000 mL | Freq: Two times a day (BID) | INTRAVENOUS | Status: DC
  Administered 2016-09-01 – 2016-09-03 (×3): 3 mL via INTRAVENOUS

## 2016-09-01 MED ORDER — ACETAMINOPHEN 325 MG PO TABS: 650.0000 mg | ORAL_TABLET | Freq: Four times a day (QID) | ORAL | Status: DC | PRN

## 2016-09-01 MED ORDER — ENOXAPARIN SODIUM 40 MG/0.4ML ~~LOC~~ SOLN
40.0000 mg | SUBCUTANEOUS | Status: DC
  Administered 2016-09-01 – 2016-09-03 (×3): 40 mg via SUBCUTANEOUS
  Filled 2016-09-01 (×3): qty 0.4

## 2016-09-01 MED ORDER — SODIUM CHLORIDE 0.9 % IV BOLUS (SEPSIS)
1000.0000 mL | Freq: Once | INTRAVENOUS | Status: AC
  Administered 2016-09-01: 1000 mL via INTRAVENOUS

## 2016-09-01 MED ORDER — IRBESARTAN 300 MG PO TABS
300.0000 mg | ORAL_TABLET | Freq: Every day | ORAL | Status: DC
  Administered 2016-09-01 – 2016-09-03 (×3): 300 mg via ORAL
  Filled 2016-09-01 (×3): qty 1

## 2016-09-01 MED ORDER — ATORVASTATIN CALCIUM 40 MG PO TABS
40.0000 mg | ORAL_TABLET | Freq: Every day | ORAL | Status: DC
  Administered 2016-09-01 – 2016-09-02 (×2): 40 mg via ORAL
  Filled 2016-09-01 (×2): qty 1

## 2016-09-01 MED ORDER — TRAMADOL HCL 50 MG PO TABS
50.0000 mg | ORAL_TABLET | Freq: Four times a day (QID) | ORAL | Status: DC | PRN
  Administered 2016-09-01 – 2016-09-02 (×4): 50 mg via ORAL
  Filled 2016-09-01 (×4): qty 1

## 2016-09-01 NOTE — ED Notes (Signed)
Pt to ct 

## 2016-09-01 NOTE — ED Triage Notes (Signed)
Per [facility], pt from home, pt c/o dizziness after getting up from bed, experienced a syncopal episode unwitnessed. Fell to the left side. Pt is AAOX4. 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 Pt not on blood thinner. Pt also c/o dizziness and fall Thursday.

## 2016-09-01 NOTE — Discharge Instructions (Signed)
Please follow-up with your PCP as scheduled on Thursday.  Your blood work is reassuring, and your do not have serious head or neck injury on your CT scans.  We recommended that you stay in the hospital for monitoring for serious causes of you passing out but you requested to go home.  Please return without fail for worsening symptoms, including recurrent passing out, chest pain, difficulty breathing, confusion or any other symptoms concerning to you.

## 2016-09-01 NOTE — ED Provider Notes (Addendum)
[facility]-EMERGENCY DEPT Provider Note   CSN: [id number] Arrival date & time: 09/01/16  4098     History   Chief Complaint Chief Complaint  Patient presents with  . Fall  . Loss of Consciousness  . Dizziness    HPI Vernon Hawking MEO Sr. is a 83 y.o. male.  HPI 83 year old male who presents with syncope and fall. History of HTN, and HLD. No blood thinners. Got up to use restroom in early morning, felt dizzy, blacked out for few seconds and fell hitting head. Able to get up since then and feels back to baseline. Did not have dinner last night.  Denies chest pain or difficulty breathing. No abdominal pain, back pain, recent GI bleed history, n/v/d, cough. No neck pain. No new injuries other than hematoma over left forehead  Past Medical History:  Diagnosis Date  . Arthritis   . Hyperlipidemia   . Hypertension     There are no active problems to display for this patient.   Past Surgical History:  Procedure Laterality Date  . APPENDECTOMY    . HERNIA REPAIR    . JOINT REPLACEMENT Bilateral    knee        Home Medications    Prior to Admission medications   Medication Sig Start Date End Date Taking? Authorizing Provider  amLODipine (NORVASC) 5 MG tablet Take 5 mg by mouth daily.   Yes [provider]  atorvastatin (LIPITOR) 40 MG tablet Take 40 mg by mouth daily.    Yes [provider]  nebivolol (BYSTOLIC) 10 MG tablet Take 10 mg by mouth daily.   Yes [provider]  omeprazole (PRILOSEC) 20 MG capsule Take 20 mg by mouth daily.   Yes [provider]  sertraline (ZOLOFT) 50 MG tablet Take 50 mg by mouth daily.   Yes [provider]  traMADol (ULTRAM) 50 MG tablet Take 50 mg by mouth every 6 (six) hours as needed for moderate pain.  08/24/16  Yes [provider]  valsartan-hydrochlorothiazide (DIOVAN-HCT) 320-25 MG tablet Take 1 tablet by mouth daily.   Yes [provider]  cephALEXin (KEFLEX) 500 MG capsule Take 1  capsule (500 mg total) by mouth 2 (two) times daily. Patient not taking: Reported on 09/01/2016 01/13/12   Shade Flood, MD    Family History No family history on file.  Social History Social History  Substance Use Topics  . Smoking status: Never Smoker  . Smokeless tobacco: Not on file  . Alcohol use Not on file     Allergies   Patient has no known allergies.   Review of Systems Review of Systems  Constitutional: Negative for fever.  Eyes: Negative for visual disturbance.  Respiratory: Negative for shortness of breath.   Cardiovascular: Negative for chest pain and leg swelling.  Gastrointestinal: Negative for abdominal pain.  Musculoskeletal: Negative for back pain.  Allergic/Immunologic: Negative for immunocompromised state.  Neurological: Negative for weakness, numbness and headaches.  Hematological: Does not bruise/bleed easily.  Psychiatric/Behavioral: Negative for confusion.  All other systems reviewed and are negative.    Physical Exam Updated Vital Signs BP (!) 114/58   Pulse (!) 52   Temp 97.9 F (36.6 C)   Resp 18   SpO2 100%   Physical Exam Physical Exam  Nursing note and vitals reviewed. Constitutional: Well developed, well nourished, non-toxic, and in no acute distress Head: Normocephalic and atraumatic.  Mouth/Throat: Oropharynx is clear and moist.  Neck: Normal range of motion. Neck  supple. no cervical spine tenderness Cardiovascular: Normal rate and regular rhythm.   Pulmonary/Chest: Effort normal and breath sounds normal.  Abdominal: Soft. There is no tenderness. There is no rebound and no guarding.  Musculoskeletal: Normal range of motion.  Neurological: Alert, no facial droop, fluent speech, moves all extremities symmetrically, PERRL Skin: Skin is warm and dry.  Psychiatric: Cooperative   ED Treatments / Results  Labs (all labs ordered are listed, but only abnormal results are displayed) Labs Reviewed  BASIC METABOLIC PANEL -  Abnormal; Notable for the following:       Result Value   Glucose, Bld 106 (*)    BUN 31 (*)    GFR calc non Af Amer 55 (*)    All other components within normal limits  CBC - Abnormal; Notable for the following:    RBC 3.50 (*)    Hemoglobin 11.2 (*)    HCT 34.3 (*)    All other components within normal limits  DIFFERENTIAL  URINALYSIS, ROUTINE W REFLEX MICROSCOPIC  CBG MONITORING, ED  I-STAT TROPOININ, ED    EKG  EKG Interpretation  Date/Time:  Saturday Sep 01 2016 08:38:38 EDT Ventricular Rate:  53 PR Interval:    QRS Duration: 102 QT Interval:  458 QTC Calculation: 430 R Axis:   12 Text Interpretation:  Sinus rhythm Ventricular premature complex Low voltage, precordial leads Minimal ST depression, inferior leads Baseline wander in lead(s) II V1 borderline first degree AV block with PVCs Confirmed by Uva Runkel MD, Annabelle Harman ([id number]) on 09/01/2016 9:18:04 AM       Radiology Ct Head Wo Contrast  Result Date: 09/01/2016 CLINICAL DATA:  Larey Seat to left side, hematoma to left upper frontal side EXAM: CT HEAD WITHOUT CONTRAST CT CERVICAL SPINE WITHOUT CONTRAST TECHNIQUE: Multidetector CT imaging of the head and cervical spine was performed following the standard protocol without intravenous contrast. Multiplanar CT image reconstructions of the cervical spine were also generated. COMPARISON:  None. FINDINGS: CT HEAD FINDINGS Brain: There is generalized parenchymal atrophy with commensurate dilatation of the ventricles and sulci. There is no mass, hemorrhage, edema or other evidence of acute parenchymal abnormality. No extra-axial hemorrhage. Vascular: There are chronic calcified atherosclerotic changes of the large vessels at the skull base. No unexpected hyperdense vessel. Skull: Normal. Negative for fracture or focal lesion. Sinuses/Orbits: No acute finding. Other: Soft tissue edema/ hematoma overlying the lower left frontal bone. No underlying fracture. CT CERVICAL SPINE FINDINGS Alignment: Slight  reversal of the normal cervical lordosis which is likely related to the underlying degenerative changes. Mild dextroscoliosis. No evidence of acute vertebral body subluxation. Skull base and vertebrae: No fracture line or displaced fracture fragment identified. Posterior facets appear intact and normally aligned throughout. Soft tissues and spinal canal: No prevertebral fluid or swelling. No visible canal hematoma. Disc levels: Degenerative changes throughout the cervical spine, moderate to severe in degree, with associated osseous spurring and disc space narrowing. 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 There are mild to moderate central canal stenosis at the remainder the levels. Additional degenerative hypertrophy of the uncovertebral and facet joints is causing moderate or severe neural foramen stenoses at the C3-4 through C6-7 levels with probable associated nerve root impingement. Upper chest: No acute findings. Other: Heavy atherosclerosis of the carotid arteries bilaterally. Extensive degenerative change at the right shoulder, incompletely imaged. IMPRESSION: 1. Soft tissue edema/hematoma overlying the lower left frontal  bone. No underlying fracture. 2. No acute intracranial abnormality. No intracranial hemorrhage or edema. 3. No fracture or acute subluxation identified within cervical spine. 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 4. Carotid atherosclerosis. Electronically Signed   By: Bary Richard M.D.   On: 09/01/2016 10:03   Ct Cervical Spine Wo Contrast  Result Date: 09/01/2016 CLINICAL DATA:  Larey Seat to left side, hematoma to left upper frontal side EXAM: CT HEAD WITHOUT CONTRAST CT CERVICAL SPINE WITHOUT CONTRAST TECHNIQUE: Multidetector CT imaging of the head and cervical spine was performed following the standard  protocol without intravenous contrast. Multiplanar CT image reconstructions of the cervical spine were also generated. COMPARISON:  None. FINDINGS: CT HEAD FINDINGS Brain: There is generalized parenchymal atrophy with commensurate dilatation of the ventricles and sulci. There is no mass, hemorrhage, edema or other evidence of acute parenchymal abnormality. No extra-axial hemorrhage. Vascular: There are chronic calcified atherosclerotic changes of the large vessels at the skull base. No unexpected hyperdense vessel. Skull: Normal. Negative for fracture or focal lesion. Sinuses/Orbits: No acute finding. Other: Soft tissue edema/ hematoma overlying the lower left frontal bone. No underlying fracture. CT CERVICAL SPINE FINDINGS Alignment: Slight reversal of the normal cervical lordosis which is likely related to the underlying degenerative changes. Mild dextroscoliosis. No evidence of acute vertebral body subluxation. Skull base and vertebrae: No fracture line or displaced fracture fragment identified. Posterior facets appear intact and normally aligned throughout. Soft tissues and spinal canal: No prevertebral fluid or swelling. No visible canal hematoma. Disc levels: Degenerative changes throughout the cervical spine, moderate to severe in degree, with associated osseous spurring and disc space narrowing. 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 There are mild to moderate central canal stenosis at the remainder the levels. Additional degenerative hypertrophy of the uncovertebral and facet joints is causing moderate or severe neural foramen stenoses at the C3-4 through C6-7 levels with probable associated nerve root impingement. Upper chest: No acute findings. Other: Heavy atherosclerosis of the carotid arteries bilaterally. Extensive degenerative change at the right  shoulder, incompletely imaged. IMPRESSION: 1. Soft tissue edema/hematoma overlying the lower left frontal bone. No underlying fracture. 2. No acute intracranial abnormality. No intracranial hemorrhage or edema. 3. No fracture or acute subluxation identified within cervical spine. 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 4. Carotid atherosclerosis. Electronically Signed   By: Bary Richard M.D.   On: 09/01/2016 10:03    Procedures Procedures (including critical care time)  Medications Ordered in ED Medications - No data to display   Initial Impression / Assessment and Plan / ED Course  I have reviewed the triage vital signs and the nursing notes.  Pertinent labs & imaging results that were available during my care of the patient were reviewed by me and considered in my medical decision making (see chart for details).    83 year old male who presents with syncope and fall. He is nontoxic in no acute distress on my evaluation. With left forehead hematoma, but without neurological deficits and is mentating normally. CT head and cervical spine visualized and does not show acute traumatic injuries of the head and neck. No other injuries reported by history or on exam.   In regards to his syncopal episode, his EKG does shows bradycardia with borderline first-degree AV block and PVCs. Blood work was reassuring. With standing, does feel dizzy and there may  be a component of orthostasis. Given age, EKG, will plan to admit for syncope evaluation.   Final Clinical Impressions(s) / ED Diagnoses   Final diagnoses:  Syncope and collapse  Traumatic hematoma of forehead, initial encounter    New Prescriptions New Prescriptions   No medications on file     Lavera GuiseLiu, Linken Mcglothen Duo, MD 09/01/16 1025    Lavera GuiseLiu, Magie Ciampa Duo, MD 09/01/16 1045

## 2016-09-01 NOTE — H&P (Signed)
History and Physical    Vernon YANDOW Sr. NWG:[id number] DOB: 15-Jun-1918 DOA: 09/01/2016   PCP: Merri Brunette, MD   Patient coming from/Resides with: Private residence/wife  Admission status: Observation/telemetry -it may be medically necessary to stay a minimum 2 midnights to rule out impending and/or unexpected changes in physiologic status that may differ from initial evaluation performed in the ER and/or at time of admission-sitter reevaluation of admission status 24 hours.   Chief Complaint: Syncope  HPI: Vernon Hawking MEO Sr. is a 83 y.o. male with medical history significant for hypertension, dyslipidemia and arthritis. Patient reports was ambulating when became dizzy and "passed out". He quickly awakened without any bowel or bladder incontinence or postictal phase. He denied chest pain or shortness of breath prior to onset of the event. CT had an ER unremarkable. Patient was noted to have sinus bradycardia with first-degree AV block on telemetry monitoring and EKG. He does take beta blocker at home.  ED Course:  Vital Signs: BP (!) 148/65   Pulse (!) 51   Temp 97.9 F (36.6 C)   Resp 16   SpO2 100%  CT head and cervical spine without contrast: No acute musculoskeletal traumatic injuries, soft tissue edema and hematoma over the left frontal bone, no acute intracranial injury Lab data: Sodium 138, potassium 4.0, chloride 104, CO2 27, glucose 106, BUN 31, creatinine 1.10, poc troponin 0.01, white count 6500 with normal differential, hemoglobin 11.2, platelets 178,000 Medications and treatments: Normal saline 1 L ordered but not yet infused Orthostatic vital signs: WNL  Review of Systems:  In addition to the HPI above,  No Fever-chills, myalgias or other constitutional symptoms No Headache, changes with Vision or hearing, new weakness, tingling, numbness in any extremity, dysarthria or word finding difficulty, gait disturbance or imbalance prior to syncopal episode, no tremors or  seizure activity No problems swallowing food or Liquids, indigestion/reflux, choking or coughing while eating, abdominal pain with or after eating No Chest pain, Cough or Shortness of Breath, palpitations, orthopnea or DOE No Abdominal pain, N/V, melena,hematochezia, dark tarry stools, constipation No dysuria, malodorous urine, hematuria or flank pain No new skin rashes, lesions, masses or bruises, No new joint pains, aches, swelling or redness No recent unintentional weight gain or loss No polyuria, polydypsia or polyphagia   Past Medical History:  Diagnosis Date  . Arthritis   . Hyperlipidemia   . Hypertension     Past Surgical History:  Procedure Laterality Date  . APPENDECTOMY    . HERNIA REPAIR    . JOINT REPLACEMENT Bilateral    knee     Social History   Social History  . Marital status: Married    Spouse name: N/A  . Number of children: N/A  . Years of education: N/A   Occupational History  . Not on file.   Social History Main Topics  . Smoking status: Never Smoker  . Smokeless tobacco: Not on file  . Alcohol use Not on file  . Drug use: Unknown  . Sexual activity: Not on file   Other Topics Concern  . Not on file   Social History Narrative  . No narrative on file    Mobility: Utilizes a cane Work history: Not obtained   No Known Allergies  Family history reviewed and not pertinent to current admission findings and diagnosis  Prior to Admission medications   Medication Sig Start Date End Date Taking? Authorizing Provider  atorvastatin (LIPITOR) 40 MG tablet Take 40 mg by mouth daily.  Yes [provider]  nebivolol (BYSTOLIC) 10 MG tablet Take 10 mg by mouth daily.   Yes [provider]  omeprazole (PRILOSEC) 20 MG capsule Take 20 mg by mouth daily.   Yes [provider]  sertraline (ZOLOFT) 50 MG tablet Take 50 mg by mouth daily.   Yes [provider]  traMADol (ULTRAM) 50 MG tablet Take 50 mg by mouth  every 6 (six) hours as needed for moderate pain.  08/24/16  Yes [provider]  valsartan-hydrochlorothiazide (DIOVAN-HCT) 320-25 MG tablet Take 1 tablet by mouth daily.   Yes [provider]  cephALEXin (KEFLEX) 500 MG capsule Take 1 capsule (500 mg total) by mouth 2 (two) times daily. Patient not taking: Reported on 09/01/2016 01/13/12   Shade FloodGreene, Jeffrey R, MD    Physical Exam: Vitals:   09/01/16 0900 09/01/16 0945 09/01/16 1000 09/01/16 1030  BP: (!) 115/55 (!) 130/55 (!) 114/58 (!) 148/65  Pulse: (!) 51 (!) 51 (!) 52 (!) 51  Resp: (!) 9 12 18 16   Temp:      SpO2: 98% 98% 100% 100%      Constitutional: NAD, calm, comfortable-Mildly hard of hearing Eyes: PERRL, lids and conjunctivae normal ENMT: Mucous membranes are dry. Posterior pharynx clear of any exudate or lesions.Normal dentition.  Neck: normal, supple, no masses, no thyromegaly Respiratory: clear to auscultation bilaterally, no wheezing, no crackles. Normal respiratory effort. No accessory muscle use.  Cardiovascular: Regular somewhat bradycardic rate with first degree AV block, no murmurs / rubs / gallops. No extremity edema. 2+ pedal pulses. No carotid bruits.  Abdomen: no tenderness, no masses palpated. No hepatosplenomegaly. Bowel sounds positive.  Musculoskeletal: no clubbing / cyanosis. No joint deformity upper and lower extremities. Good ROM, no contractures. Normal muscle tone.  Skin: no rashes, lesions, ulcers. No induration Neurologic: CN 2-12 grossly intact. Sensation intact, DTR normal. Strength 5/5 x all 4 extremities.  Psychiatric: Normal judgment and insight. Alert and oriented x 3. Normal mood. Seems to have some short-term memory difficulties which are very mild.   Labs on Admission: I have personally reviewed following labs and imaging studies  CBC:  Recent Labs Lab 09/01/16 0846  WBC 6.5  NEUTROABS 4.4  HGB 11.2*  HCT 34.3*  MCV 98.0  PLT 178   Basic Metabolic Panel:  Recent  Labs Lab 09/01/16 0846  NA 138  K 4.0  CL 104  CO2 27  GLUCOSE 106*  BUN 31*  CREATININE 1.10  CALCIUM 9.6   GFR: CrCl cannot be calculated (Unknown ideal weight.). Liver Function Tests: No results for input(s): AST, ALT, ALKPHOS, BILITOT, PROT, ALBUMIN in the last 168 hours. No results for input(s): LIPASE, AMYLASE in the last 168 hours. No results for input(s): AMMONIA in the last 168 hours. Coagulation Profile: No results for input(s): INR, PROTIME in the last 168 hours. Cardiac Enzymes: No results for input(s): CKTOTAL, CKMB, CKMBINDEX, TROPONINI in the last 168 hours. BNP (last 3 results) No results for input(s): PROBNP in the last 8760 hours. HbA1C: No results for input(s): HGBA1C in the last 72 hours. CBG:  Recent Labs Lab 09/01/16 0912  GLUCAP 93   Lipid Profile: No results for input(s): CHOL, HDL, LDLCALC, TRIG, CHOLHDL, LDLDIRECT in the last 72 hours. Thyroid Function Tests: No results for input(s): TSH, T4TOTAL, FREET4, T3FREE, THYROIDAB in the last 72 hours. Anemia Panel: No results for input(s): VITAMINB12, FOLATE, FERRITIN, TIBC, IRON, RETICCTPCT in the last 72 hours. Urine analysis: No results found for: COLORURINE, APPEARANCEUR,  LABSPEC, PHURINE, GLUCOSEU, HGBUR, BILIRUBINUR, KETONESUR, PROTEINUR, UROBILINOGEN, NITRITE, LEUKOCYTESUR Sepsis Labs: @LABRCNTIP (procalcitonin:4,lacticidven:4) )No results found for this or any previous visit (from the past 240 hour(s)).   Radiological Exams on Admission: Ct Head Wo Contrast  Result Date: 09/01/2016 CLINICAL DATA:  Larey Seat to left side, hematoma to left upper frontal side EXAM: CT HEAD WITHOUT CONTRAST CT CERVICAL SPINE WITHOUT CONTRAST TECHNIQUE: Multidetector CT imaging of the head and cervical spine was performed following the standard protocol without intravenous contrast. Multiplanar CT image reconstructions of the cervical spine were also generated. COMPARISON:  None. FINDINGS: CT HEAD FINDINGS Brain: There  is generalized parenchymal atrophy with commensurate dilatation of the ventricles and sulci. There is no mass, hemorrhage, edema or other evidence of acute parenchymal abnormality. No extra-axial hemorrhage. Vascular: There are chronic calcified atherosclerotic changes of the large vessels at the skull base. No unexpected hyperdense vessel. Skull: Normal. Negative for fracture or focal lesion. Sinuses/Orbits: No acute finding. Other: Soft tissue edema/ hematoma overlying the lower left frontal bone. No underlying fracture. CT CERVICAL SPINE FINDINGS Alignment: Slight reversal of the normal cervical lordosis which is likely related to the underlying degenerative changes. Mild dextroscoliosis. No evidence of acute vertebral body subluxation. Skull base and vertebrae: No fracture line or displaced fracture fragment identified. Posterior facets appear intact and normally aligned throughout. Soft tissues and spinal canal: No prevertebral fluid or swelling. No visible canal hematoma. Disc levels: Degenerative changes throughout the cervical spine, moderate to severe in degree, with associated osseous spurring and disc space narrowing. 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 There are mild to moderate central canal stenosis at the remainder the levels. Additional degenerative hypertrophy of the uncovertebral and facet joints is causing moderate or severe neural foramen stenoses at the C3-4 through C6-7 levels with probable associated nerve root impingement. Upper chest: No acute findings. Other: Heavy atherosclerosis of the carotid arteries bilaterally. Extensive degenerative change at the right shoulder, incompletely imaged. IMPRESSION: 1. Soft tissue edema/hematoma overlying the lower left frontal bone. No underlying fracture. 2. No acute intracranial abnormality. No  intracranial hemorrhage or edema. 3. No fracture or acute subluxation identified within cervical spine. 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 4. Carotid atherosclerosis. Electronically Signed   By: Bary Richard M.D.   On: 09/01/2016 10:03   Ct Cervical Spine Wo Contrast  Result Date: 09/01/2016 CLINICAL DATA:  Larey Seat to left side, hematoma to left upper frontal side EXAM: CT HEAD WITHOUT CONTRAST CT CERVICAL SPINE WITHOUT CONTRAST TECHNIQUE: Multidetector CT imaging of the head and cervical spine was performed following the standard protocol without intravenous contrast. Multiplanar CT image reconstructions of the cervical spine were also generated. COMPARISON:  None. FINDINGS: CT HEAD FINDINGS Brain: There is generalized parenchymal atrophy with commensurate dilatation of the ventricles and sulci. There is no mass, hemorrhage, edema or other evidence of acute parenchymal abnormality. No extra-axial hemorrhage. Vascular: There are chronic calcified atherosclerotic changes of the large vessels at the skull base. No unexpected hyperdense vessel. Skull: Normal. Negative for fracture or focal lesion. Sinuses/Orbits: No acute finding. Other: Soft tissue edema/ hematoma overlying the lower left frontal bone. No underlying fracture. CT CERVICAL SPINE FINDINGS Alignment: Slight reversal of the normal cervical lordosis which is likely related to the underlying degenerative changes. Mild dextroscoliosis. No evidence of acute vertebral body subluxation. Skull base and vertebrae: No fracture line or displaced fracture  fragment identified. Posterior facets appear intact and normally aligned throughout. Soft tissues and spinal canal: No prevertebral fluid or swelling. No visible canal hematoma. Disc levels: Degenerative changes throughout the cervical spine, moderate to severe in degree, with associated osseous spurring and disc space narrowing. Most prominent disc-osteophytic bulge, with  overlying ossification of the posterior longitudinal ligament, is seen at the C5-6 level resulting in moderate to severe central canal stenosis. There are mild to moderate central canal stenosis at the remainder the levels. Additional degenerative hypertrophy of the uncovertebral and facet joints is causing moderate or severe neural foramen stenoses at the C3-4 through C6-7 levels with probable associated nerve root impingement. Upper chest: No acute findings. Other: Heavy atherosclerosis of the carotid arteries bilaterally. Extensive degenerative change at the right shoulder, incompletely imaged. IMPRESSION: 1. Soft tissue edema/hematoma overlying the lower left frontal bone. No underlying fracture. 2. No acute intracranial abnormality. No intracranial hemorrhage or edema. 3. No fracture or acute subluxation identified within cervical spine. 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 4. Carotid atherosclerosis. Electronically Signed   By: Bary Richard M.D.   On: 09/01/2016 10:03    EKG: (Independently reviewed) sinus bradycardia with first-degree AV block ventricular rate 53 bpm, QTC 430 ms, no ischemic changes  Assessment/Plan Principal Problem:   Syncope -Resistance with syncopal episode seated by dizziness without postictal phase -Normal orthostatic vital signs -Telemetry with sinus bradycardia with first-degree AV block -Hold thiazide diuretic and beta blocker (Bystolic) -BUN slightly elevated and although not orthostatic suspect patient no longer needs diuretic-denies history of lower extremity edema -Continue telemetry monitoring -Echocardiogram -PT/OT evaluation -Given bradycardia check TSH -Ck UA in the event this is atypical presentation of UTI  Active Problems:   Symptomatic bradycardia -Suspect this is etiology to patient's syncopal episode -Holding beta blocker -Telemetry as above    Hypertension -Patient and wife both state patient no longer  taking amlodipine -Continuing valsartan but holding HCTZ -Beta blocker on hold -Current blood pressure controlled    HLD (hyperlipidemia) -Continue Lipitor    Anemia -Mild decreased hemoglobin -MCV normal -Anemia panel -Follow up on TSH      DVT prophylaxis: Lovenox Code Status: Full Family Communication: Wife  Disposition Plan: Home Consults called: None     ELLIS,ALLISON L. ANP-BC [facility] Pager [phone]   If 7PM-7AM, please contact night-coverage [email or web address] Password [email or web address]  09/01/2016, 11:13 AM

## 2016-09-01 NOTE — ED Notes (Signed)
Pt agreeable to stay, tried to get up off the bed and felt very dizzy. Pt placed back in bed.

## 2016-09-02 ENCOUNTER — Observation Stay (HOSPITAL_COMMUNITY): Payer: Medicare Other

## 2016-09-02 ENCOUNTER — Encounter (HOSPITAL_COMMUNITY): Payer: Self-pay | Admitting: *Deleted

## 2016-09-02 DIAGNOSIS — E785 Hyperlipidemia, unspecified: Secondary | ICD-10-CM | POA: Diagnosis present

## 2016-09-02 DIAGNOSIS — I440 Atrioventricular block, first degree: Secondary | ICD-10-CM | POA: Diagnosis present

## 2016-09-02 DIAGNOSIS — Z96653 Presence of artificial knee joint, bilateral: Secondary | ICD-10-CM | POA: Diagnosis present

## 2016-09-02 DIAGNOSIS — I10 Essential (primary) hypertension: Secondary | ICD-10-CM | POA: Diagnosis present

## 2016-09-02 DIAGNOSIS — R55 Syncope and collapse: Secondary | ICD-10-CM | POA: Diagnosis not present

## 2016-09-02 DIAGNOSIS — D649 Anemia, unspecified: Secondary | ICD-10-CM | POA: Diagnosis present

## 2016-09-02 DIAGNOSIS — Z79899 Other long term (current) drug therapy: Secondary | ICD-10-CM | POA: Diagnosis not present

## 2016-09-02 DIAGNOSIS — R001 Bradycardia, unspecified: Secondary | ICD-10-CM | POA: Diagnosis not present

## 2016-09-02 LAB — BASIC METABOLIC PANEL
ANION GAP: 7 (ref 5–15)
BUN: 25 mg/dL — ABNORMAL HIGH (ref 6–20)
CALCIUM: 9.2 mg/dL (ref 8.9–10.3)
CO2: 26 mmol/L (ref 22–32)
Chloride: 105 mmol/L (ref 101–111)
Creatinine, Ser: 1.06 mg/dL (ref 0.61–1.24)
GFR, EST NON AFRICAN AMERICAN: 58 mL/min — AB (ref >60)
GLUCOSE: 98 mg/dL (ref 65–99)
POTASSIUM: 3.7 mmol/L (ref 3.5–5.1)
Sodium: 138 mmol/L (ref 135–145)

## 2016-09-02 LAB — ECHOCARDIOGRAM COMPLETE
Height: 66 in
Weight: 2540.8 oz

## 2016-09-02 NOTE — Progress Notes (Signed)
Echocardiogram 2D Echocardiogram has been performed.  Dorothey BasemanReel, Noeli Lavery M 09/02/2016, 4:27 PM

## 2016-09-02 NOTE — Progress Notes (Signed)
Physical Therapy Brief Eval Note 09/02/16  Pt seen for therapy evaluation. Pt requires min guard for mobility to supervision and will require HHPT at discharge. Lives at home with his wife and has help 3 days a week to run errands and for household chores. Will need HHPT at discharge.   Complete evaluation to follow.  Colin BroachSabra M. Nino Amano PT, DPT  [phone]

## 2016-09-02 NOTE — Evaluation (Signed)
Physical Therapy Evaluation Patient Details Name: Vernon Walton. MRN: [id number] DOB: 1918-09-17 Today's Date: 09/02/2016   History of Present Illness  Pti s a 83 yo male admitted through ED following a syncopal episode. PMH significant for HTN, HLD, OA, TKA bilaterally.   Clinical Impression  Pt presents with the above diagnosis and below deficits for therapy evaluation. Prior to admission, pt was able to perform short distance gait with his cane and was able to perform all his own bathing and dressing. Pt lives with his wife in a two-story home who also uses a cane. Wife reports that the patient has had multiple falls over the past few months. Pt requires min guard to majority of mobility this session with RW. Pt advised to use RW throughout home when he returns in order to reduce risk for falls. Pt will benefit from continued acute PT services to address below deficits including stair negotiation prior to DC.     Follow Up Recommendations Home health PT    Equipment Recommendations  None recommended by PT    Recommendations for Other Services       Precautions / Restrictions Precautions Precautions: Fall Restrictions Weight Bearing Restrictions: No      Mobility  Bed Mobility Overal bed mobility: Modified Independent             General bed mobility comments: able to get EOB without assistance with HOB flat  Transfers Overall transfer level: Needs assistance Equipment used: Rolling walker (2 wheeled) Transfers: Sit to/from Stand Sit to Stand: Supervision         General transfer comment: supervision from EOB  Ambulation/Gait Ambulation/Gait assistance: Min guard Ambulation Distance (Feet): 50 Feet Assistive device: Rolling walker (2 wheeled) Gait Pattern/deviations: Step-through pattern Gait velocity: decreased Gait velocity interpretation: Below normal speed for age/gender General Gait Details: decreased step length bilaterally, forward trunk lean.    Stairs            Wheelchair Mobility    Modified Rankin (Stroke Patients Only)       Balance Overall balance assessment: History of Falls                                           Pertinent Vitals/Pain Pain Assessment: No/denies pain    Home Living Family/patient expects to be discharged to:: Private residence Living Arrangements: Spouse/significant other Available Help at Discharge: Family;Available PRN/intermittently Type of Home: House Home Access: Stairs to enter Entrance Stairs-Rails: Right Entrance Stairs-Number of Steps: 2-3 Home Layout: Two level Home Equipment: Walker - 2 wheels;Cane - single point;Bedside commode      Prior Function Level of Independence: Independent with assistive device(s)         Comments: Used cane for mobility around home, has a paid CG 3 days a week to assist with household chores and shopping     Hand Dominance   Dominant Hand: Right    Extremity/Trunk Assessment   Upper Extremity Assessment Upper Extremity Assessment: Defer to OT evaluation    Lower Extremity Assessment Lower Extremity Assessment: Overall WFL for tasks assessed    Cervical / Trunk Assessment Cervical / Trunk Assessment: Kyphotic  Communication   Communication: No difficulties;HOH  Cognition Arousal/Alertness: Awake/alert Behavior During Therapy: WFL for tasks assessed/performed Overall Cognitive Status: Within Functional Limits for tasks assessed  General Comments      Exercises     Assessment/Plan    PT Assessment Patient needs continued PT services  PT Problem List Decreased strength;Decreased range of motion;Decreased activity tolerance;Decreased balance;Decreased mobility;Decreased knowledge of use of DME;Pain       PT Treatment Interventions      PT Goals (Current goals can be found in the Care Plan section)  Acute Rehab PT Goals Patient Stated Goal:  to get back home PT Goal Formulation: With patient Time For Goal Achievement: 09/09/16 Potential to Achieve Goals: Good    Frequency Min 3X/week   Barriers to discharge        Co-evaluation               AM-PAC PT "6 Clicks" Daily Activity  Outcome Measure Difficulty turning over in bed (including adjusting bedclothes, sheets and blankets)?: None Difficulty moving from lying on back to sitting on the side of the bed? : None Difficulty sitting down on and standing up from a chair with arms (e.g., wheelchair, bedside commode, etc,.)?: A Little Help needed moving to and from a bed to chair (including a wheelchair)?: A Little Help needed walking in hospital room?: A Little Help needed climbing 3-5 steps with a railing? : A Little 6 Click Score: 20    End of Session Equipment Utilized During Treatment: Gait belt Activity Tolerance: Patient tolerated treatment well Patient left: in chair;with call bell/phone within reach;with family/visitor present Nurse Communication: Mobility status PT Visit Diagnosis: Other abnormalities of gait and mobility (R26.89);Muscle weakness (generalized) (M62.81);Difficulty in walking, not elsewhere classified (R26.2)    Time: [id number] PT Time Calculation (min) (ACUTE ONLY): 12 min   Charges:   PT Evaluation $PT Eval Moderate Complexity: 1 Procedure     PT G Codes:   PT G-Codes **NOT FOR INPATIENT CLASS** Functional Assessment Tool Used: AM-PAC 6 Clicks Basic Mobility;Clinical judgement Functional Limitation: Mobility: Walking and moving around Mobility: Walking and Moving Around Current Status ([id number]([id number]): At least 20 percent but less than 40 percent impaired, limited or restricted Mobility: Walking and Moving Around Goal Status [phone]([id number]): At least 1 percent but less than 20 percent impaired, limited or restricted    Vernon Walton PT, DPT  [phone]   Vernon Walton 09/02/2016, 1:04 PM

## 2016-09-02 NOTE — Progress Notes (Signed)
Patient ID: Vernon HawkingARMAND [name] Sr., male   DOB: Feb 24, 1919, 83 y.o.   MRN: [id number]  PROGRESS NOTE    Vernon HawkingRMAND [name] Sr.  WUJ:[id number] DOB: Feb 24, 1919 DOA: 09/01/2016 PCP: Merri BrunettePharr, Walter, MD   Brief Narrative:   83 y.o. male with medical history significant for hypertension, dyslipidemia and arthritis presented with dizziness and probable syncope. He was found to be bradycardic. Beta blocker has been on hold. 2-D echo is pending Assessment & Plan:   Principal Problem:   Syncope Active Problems:   Symptomatic bradycardia   Hypertension   HLD (hyperlipidemia)   Anemia  Syncope -Continue telemetry monitoring. Follow 2-D echo. Keep beta blocker on hold -PT/OT evaluation. Cardiology evaluation if 2-D echo is abnormal. Add ultrasound of carotids    Symptomatic bradycardia -Suspect this is etiology to patient's syncopal episode -Holding beta blocker -Awaiting  2-D echo    Hypertension -Patient and wife both state patient no longer taking amlodipine -Continuing valsartan but holding HCTZ -Beta blocker on hold -Current blood pressure controlled    HLD (hyperlipidemia) -Continue Lipitor    Anemia -Questionable cause. Repeat hemoglobin for tomorrow  DVT prophylaxis: Lovenox Code Status:  Full Family Communication: Discussed with wife present at bedside Disposition Plan: Home in 1-2 days  Consultants: None  Procedures: Echo pending  Antimicrobials: None   Subjective: Patient seen and examined at bedside. He denies any overnight fever, nausea, vomiting.  Objective: Vitals:   09/01/16 1100 09/01/16 1236 09/01/16 2027 09/02/16 0552  BP: (!) 146/60 (!) 110/56 (!) 129/53 139/69  Pulse: (!) 59 99 (!) 53 (!) 56  Resp: 15 18 18 18   Temp:  98 F (36.7 C) 98.5 F (36.9 C) 98.1 F (36.7 C)  TempSrc:  Oral Oral Oral  SpO2: 99% 100% 98% 96%  Weight:  73.4 kg (161 lb 12.8 oz)  72 kg (158 lb 12.8 oz)  Height:  5\' 6"  (1.676 m)      Intake/Output Summary (Last 24 hours) at  09/02/16 1322 Last data filed at 09/02/16 1001  Gross per 24 hour  Intake              483 ml  Output              900 ml  Net             -417 ml   Filed Weights   09/01/16 1236 09/02/16 0552  Weight: 73.4 kg (161 lb 12.8 oz) 72 kg (158 lb 12.8 oz)    Examination:  General exam: Appears calm and comfortable  Respiratory system: Bilateral decreased breath sound at bases Cardiovascular system: S1 & S2 heard, Rate controlled  Gastrointestinal system: Abdomen is nondistended, soft and nontender. No organomegaly or masses felt. Normal bowel sounds heard. Extremities: No cyanosis, clubbing, edema     Data Reviewed: I have personally reviewed following labs and imaging studies  CBC:  Recent Labs Lab 09/01/16 0846  WBC 6.5  NEUTROABS 4.4  HGB 11.2*  HCT 34.3*  MCV 98.0  PLT 178   Basic Metabolic Panel:  Recent Labs Lab 09/01/16 0846 09/02/16 0411  NA 138 138  K 4.0 3.7  CL 104 105  CO2 27 26  GLUCOSE 106* 98  BUN 31* 25*  CREATININE 1.10 1.06  CALCIUM 9.6 9.2   GFR: Estimated Creatinine Clearance: 37.6 mL/min (by C-G formula based on SCr of 1.06 mg/dL). Liver Function Tests: No results for input(s): AST, ALT, ALKPHOS, BILITOT, PROT, ALBUMIN in the last 168 hours. No  results for input(s): LIPASE, AMYLASE in the last 168 hours. No results for input(s): AMMONIA in the last 168 hours. Coagulation Profile: No results for input(s): INR, PROTIME in the last 168 hours. Cardiac Enzymes:  Recent Labs Lab 09/01/16 1827  TROPONINI <0.03   BNP (last 3 results) No results for input(s): PROBNP in the last 8760 hours. HbA1C: No results for input(s): HGBA1C in the last 72 hours. CBG:  Recent Labs Lab 09/01/16 0912  GLUCAP 93   Lipid Profile: No results for input(s): CHOL, HDL, LDLCALC, TRIG, CHOLHDL, LDLDIRECT in the last 72 hours. Thyroid Function Tests:  Recent Labs  09/01/16 1055  TSH 1.926   Anemia Panel:  Recent Labs  09/01/16 1055  VITAMINB12  3,251*  FOLATE 39.6  FERRITIN 131  TIBC 304  IRON 89  RETICCTPCT 1.3   Sepsis Labs: No results for input(s): PROCALCITON, LATICACIDVEN in the last 168 hours.  No results found for this or any previous visit (from the past 240 hour(s)).       Radiology Studies: Ct Head Wo Contrast  Result Date: 09/01/2016 CLINICAL DATA:  Larey Seat to left side, hematoma to left upper frontal side EXAM: CT HEAD WITHOUT CONTRAST CT CERVICAL SPINE WITHOUT CONTRAST TECHNIQUE: Multidetector CT imaging of the head and cervical spine was performed following the standard protocol without intravenous contrast. Multiplanar CT image reconstructions of the cervical spine were also generated. COMPARISON:  None. FINDINGS: CT HEAD FINDINGS Brain: There is generalized parenchymal atrophy with commensurate dilatation of the ventricles and sulci. There is no mass, hemorrhage, edema or other evidence of acute parenchymal abnormality. No extra-axial hemorrhage. Vascular: There are chronic calcified atherosclerotic changes of the large vessels at the skull base. No unexpected hyperdense vessel. Skull: Normal. Negative for fracture or focal lesion. Sinuses/Orbits: No acute finding. Other: Soft tissue edema/ hematoma overlying the lower left frontal bone. No underlying fracture. CT CERVICAL SPINE FINDINGS Alignment: Slight reversal of the normal cervical lordosis which is likely related to the underlying degenerative changes. Mild dextroscoliosis. No evidence of acute vertebral body subluxation. Skull base and vertebrae: No fracture line or displaced fracture fragment identified. Posterior facets appear intact and normally aligned throughout. Soft tissues and spinal canal: No prevertebral fluid or swelling. No visible canal hematoma. Disc levels: Degenerative changes throughout the cervical spine, moderate to severe in degree, with associated osseous spurring and disc space narrowing. Most prominent disc-osteophytic bulge, with overlying  ossification of the posterior longitudinal ligament, is seen at the C5-6 level resulting in moderate to severe central canal stenosis. There are mild to moderate central canal stenosis at the remainder the levels. Additional degenerative hypertrophy of the uncovertebral and facet joints is causing moderate or severe neural foramen stenoses at the C3-4 through C6-7 levels with probable associated nerve root impingement. Upper chest: No acute findings. Other: Heavy atherosclerosis of the carotid arteries bilaterally. Extensive degenerative change at the right shoulder, incompletely imaged. IMPRESSION: 1. Soft tissue edema/hematoma overlying the lower left frontal bone. No underlying fracture. 2. No acute intracranial abnormality. No intracranial hemorrhage or edema. 3. No fracture or acute subluxation identified within cervical spine. 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 4. Carotid atherosclerosis. Electronically Signed   By: Bary Richard M.D.   On: 09/01/2016 10:03   Ct Cervical Spine Wo Contrast  Result Date: 09/01/2016 CLINICAL DATA:  Larey Seat to left side, hematoma to left upper frontal side EXAM: CT HEAD WITHOUT CONTRAST CT CERVICAL SPINE WITHOUT CONTRAST TECHNIQUE: Multidetector CT imaging of the head  and cervical spine was performed following the standard protocol without intravenous contrast. Multiplanar CT image reconstructions of the cervical spine were also generated. COMPARISON:  None. FINDINGS: CT HEAD FINDINGS Brain: There is generalized parenchymal atrophy with commensurate dilatation of the ventricles and sulci. There is no mass, hemorrhage, edema or other evidence of acute parenchymal abnormality. No extra-axial hemorrhage. Vascular: There are chronic calcified atherosclerotic changes of the large vessels at the skull base. No unexpected hyperdense vessel. Skull: Normal. Negative for fracture or focal lesion. Sinuses/Orbits: No acute finding. Other: Soft tissue  edema/ hematoma overlying the lower left frontal bone. No underlying fracture. CT CERVICAL SPINE FINDINGS Alignment: Slight reversal of the normal cervical lordosis which is likely related to the underlying degenerative changes. Mild dextroscoliosis. No evidence of acute vertebral body subluxation. Skull base and vertebrae: No fracture line or displaced fracture fragment identified. Posterior facets appear intact and normally aligned throughout. Soft tissues and spinal canal: No prevertebral fluid or swelling. No visible canal hematoma. Disc levels: Degenerative changes throughout the cervical spine, moderate to severe in degree, with associated osseous spurring and disc space narrowing. 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 There are mild to moderate central canal stenosis at the remainder the levels. Additional degenerative hypertrophy of the uncovertebral and facet joints is causing moderate or severe neural foramen stenoses at the C3-4 through C6-7 levels with probable associated nerve root impingement. Upper chest: No acute findings. Other: Heavy atherosclerosis of the carotid arteries bilaterally. Extensive degenerative change at the right shoulder, incompletely imaged. IMPRESSION: 1. Soft tissue edema/hematoma overlying the lower left frontal bone. No underlying fracture. 2. No acute intracranial abnormality. No intracranial hemorrhage or edema. 3. No fracture or acute subluxation identified within cervical spine. 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 4. Carotid atherosclerosis. Electronically Signed   By: Bary Richard M.D.   On: 09/01/2016 10:03        Scheduled Meds: . atorvastatin  40 mg Oral q1800  . enoxaparin (LOVENOX) injection  40 mg Subcutaneous Q24H  . irbesartan  300 mg Oral Daily  . pantoprazole  40 mg  Oral Daily  . sodium chloride flush  3 mL Intravenous Q12H   Continuous Infusions:   LOS: 0 days       Glade Lloyd, MD [facility] Pager [phone]  If 7PM-7AM, please contact night-coverage [email or web address] Password [email or web address] 09/02/2016, 1:22 PM

## 2016-09-03 ENCOUNTER — Inpatient Hospital Stay (HOSPITAL_COMMUNITY): Payer: Medicare Other

## 2016-09-03 DIAGNOSIS — R55 Syncope and collapse: Secondary | ICD-10-CM

## 2016-09-03 LAB — BASIC METABOLIC PANEL
ANION GAP: 10 (ref 5–15)
BUN: 21 mg/dL — ABNORMAL HIGH (ref 6–20)
CALCIUM: 9.1 mg/dL (ref 8.9–10.3)
CO2: 26 mmol/L (ref 22–32)
Chloride: 104 mmol/L (ref 101–111)
Creatinine, Ser: 1.05 mg/dL (ref 0.61–1.24)
GFR, EST NON AFRICAN AMERICAN: 58 mL/min — AB (ref >60)
Glucose, Bld: 106 mg/dL — ABNORMAL HIGH (ref 65–99)
POTASSIUM: 3.6 mmol/L (ref 3.5–5.1)
Sodium: 140 mmol/L (ref 135–145)

## 2016-09-03 LAB — MAGNESIUM: Magnesium: 1.6 mg/dL — ABNORMAL LOW (ref 1.7–2.4)

## 2016-09-03 MED ORDER — VALSARTAN 320 MG PO TABS: 320.0000 mg | ORAL_TABLET | Freq: Every day | ORAL | 0 refills | Status: DC

## 2016-09-03 NOTE — Progress Notes (Signed)
Physical Therapy Treatment Patient Details Name: Vernon Walton. MRN: [id number] DOB: 21-Aug-1918 Today's Date: 09/03/2016    History of Present Illness Pti s a 83 yo male admitted through ED following a syncopal episode. PMH significant for HTN, HLD, OA, TKA bilaterally.     PT Comments    Patient continues to demonstrate balance deficits and will need to use RW for mobility upon d/c. Pt agrees he is much more steady with RW vs cane. Pt able to negotiate steps with min guard assist for safety. Pt will continue to benefit from further skilled PT services in acute setting and upon d/c home to maximize independence and safety with mobility.    Follow Up Recommendations  Home health PT     Equipment Recommendations  Rolling walker with 5" wheels;Other (comment) (youth sized RW)    Recommendations for Other Services       Precautions / Restrictions Precautions Precautions: Fall Restrictions Weight Bearing Restrictions: No    Mobility  Bed Mobility Overal bed mobility: Modified Independent             General bed mobility comments: able to get EOB without assistance with HOB flat  Transfers Overall transfer level: Needs assistance Equipment used: Rolling walker (2 wheeled) Transfers: Sit to/from Stand Sit to Stand: Supervision;Min guard         General transfer comment: sit to stand X3 from EOB; pt able to stand with supervision for safety and min guard when pulling up pants in standing; pt uses bilat LE against bed for balance and unable to maintain balance when reaching outside BOS without external support  Ambulation/Gait Ambulation/Gait assistance: Min guard Ambulation Distance (Feet): 150 Feet Assistive device: Rolling walker (2 wheeled) Gait Pattern/deviations: Step-through pattern;Trunk flexed Gait velocity: decreased   General Gait Details: cues for posture and safe proximity of RW; pt unsteady especially with turning and stepping backwards however no LOB  noted    Stairs Stairs: Yes   Stair Management: Two rails;One rail Left;Step to pattern;Forwards;Sideways Number of Stairs: 10 General stair comments: stair negotiation with bilat rails to simulate home entrance and L hand rail to simulate stairs inside home; sideways to descend; cues for sequencing and safety  Wheelchair Mobility    Modified Rankin (Stroke Patients Only)       Balance Overall balance assessment: History of Falls                                          Cognition Arousal/Alertness: Awake/alert Behavior During Therapy: WFL for tasks assessed/performed Overall Cognitive Status: Within Functional Limits for tasks assessed                                        Exercises      General Comments        Pertinent Vitals/Pain Pain Assessment: No/denies pain    Home Living                      Prior Function            PT Goals (current goals can now be found in the care plan section) Acute Rehab PT Goals PT Goal Formulation: With patient Time For Goal Achievement: 09/09/16 Potential to Achieve Goals: Good Progress towards PT goals: Progressing toward  goals    Frequency    Min 3X/week      PT Plan Current plan remains appropriate    Co-evaluation              AM-PAC PT "6 Clicks" Daily Activity  Outcome Measure  Difficulty turning over in bed (including adjusting bedclothes, sheets and blankets)?: None Difficulty moving from lying on back to sitting on the side of the bed? : None Difficulty sitting down on and standing up from a chair with arms (e.g., wheelchair, bedside commode, etc,.)?: A Little Help needed moving to and from a bed to chair (including a wheelchair)?: A Little Help needed walking in hospital room?: A Little Help needed climbing 3-5 steps with a railing? : A Little 6 Click Score: 20    End of Session Equipment Utilized During Treatment: Gait belt Activity Tolerance:  Patient tolerated treatment well Patient left: with family/visitor present;in bed;with bed alarm set Nurse Communication: Mobility status PT Visit Diagnosis: Other abnormalities of gait and mobility (R26.89);Muscle weakness (generalized) (M62.81);Difficulty in walking, not elsewhere classified (R26.2)     Time: [id number] PT Time Calculation (min) (ACUTE ONLY): 22 min  Charges:  $Gait Training: 8-22 mins                    G Codes:       Erline LevineKellyn Cass Edinger, PTA Pager: [phone]     Carolynne EdouardKellyn R Sherie Dobrowolski 09/03/2016, 11:05 AM

## 2016-09-03 NOTE — Progress Notes (Signed)
Paged MD regarding pt ready to be discharged. No results of VAS US at this time. Dan HumphreysWalker has been delivered. Awaiting confirmation   Aditri Louischarles [name]

## 2016-09-03 NOTE — Discharge Summary (Signed)
Physician Discharge Summary  Vernon Walton MEO Sr. ZOX:[id number] DOB: 07/20/1918 DOA: 09/01/2016  PCP: Merri Brunette, MD  Admit date: 09/01/2016 Discharge date: 09/03/2016  Admitted From: Home Disposition:    Recommendations for Outpatient Follow-up:  Follow up with PCP in 1-2 weeks Patient will benefit from evaluation by a cardiologist in 2-3 weeks time  Home Health: Yes  Equipment/Devices: None  Discharge Condition: Stable CODE STATUS: Full Diet recommendation: Heart Healthy   Brief/Interim Summary: 83 y.o.malewith medical history significant for hypertension, dyslipidemia and arthritis presented with dizziness and probable syncope. He was found to be bradycardic. Beta blocker has been on hold. 2-D echo showed ejection fraction of 55-60% with grade 1 diastolic dysfunction. He feels better and wants to go home. He would benefit from outpatient cardiology evaluation. He'll be discharged after carotid ultrasound is done today.  Discharge Diagnoses:  Principal Problem:   Syncope Active Problems:   Symptomatic bradycardia   Hypertension   HLD (hyperlipidemia)   Anemia  Syncope -No further episodes of syncope. Hold beta blocker and hydrochlorothiazide. 2-D echo shows ejection fraction of 55-60% and grade 1 diastolic dysfunction. Discharge patient home with home health PT after ultrasound carotids done today. Outpatient follow-up with cardiology if needed.  Symptomatic bradycardia -Suspect this is etiology to patient's syncopal episode -Holding beta blocker   Hypertension -Continuing valsartan but holding HCTZ -Beta blocker on hold -Current blood pressure controlled  HLD (hyperlipidemia) -Continue Lipitor  Anemia -Questionable cause. Hemoglobin stable  Discharge Instructions  Discharge Instructions    Call MD for:  difficulty breathing, headache or visual disturbances    Complete by:  As directed    Call MD for:  persistant dizziness or light-headedness     Complete by:  As directed    Call MD for:  persistant nausea and vomiting    Complete by:  As directed    Call MD for:  severe uncontrolled pain    Complete by:  As directed    Call MD for:  temperature >100.4    Complete by:  As directed    Diet - low sodium heart healthy    Complete by:  As directed    Increase activity slowly    Complete by:  As directed      Allergies as of 09/03/2016   No Known Allergies     Medication List    STOP taking these medications   cephALEXin 500 MG capsule Commonly known as:  KEFLEX   nebivolol 10 MG tablet Commonly known as:  BYSTOLIC   valsartan-hydrochlorothiazide 320-25 MG tablet Commonly known as:  DIOVAN-HCT     TAKE these medications   atorvastatin 40 MG tablet Commonly known as:  LIPITOR Take 40 mg by mouth daily.   omeprazole 20 MG capsule Commonly known as:  PRILOSEC Take 20 mg by mouth daily.   sertraline 50 MG tablet Commonly known as:  ZOLOFT Take 50 mg by mouth daily.   traMADol 50 MG tablet Commonly known as:  ULTRAM Take 50 mg by mouth every 6 (six) hours as needed for moderate pain.   valsartan 320 MG tablet Commonly known as:  DIOVAN Take 1 tablet (320 mg total) by mouth daily.      Follow-up Information    [facility] EMERGENCY DEPARTMENT Follow up.   Specialty:  Emergency Medicine Why:  If symptoms worsen Contact information: [address] [id number] mc [place] [address] [phone]       Merri Brunette, MD Follow up in 1  week(s).   Specialty:  Internal Medicine Contact information: [address] [place] [address] [place] [address] [phone]          No Known Allergies  Consultations: None  Procedures/Studies: Ct Head Wo Contrast  Result Date: 09/01/2016 CLINICAL DATA:  Larey Seat to left side, hematoma to left upper frontal side EXAM: CT HEAD WITHOUT CONTRAST CT CERVICAL SPINE WITHOUT CONTRAST TECHNIQUE: Multidetector CT imaging of the head  and cervical spine was performed following the standard protocol without intravenous contrast. Multiplanar CT image reconstructions of the cervical spine were also generated. COMPARISON:  None. FINDINGS: CT HEAD FINDINGS Brain: There is generalized parenchymal atrophy with commensurate dilatation of the ventricles and sulci. There is no mass, hemorrhage, edema or other evidence of acute parenchymal abnormality. No extra-axial hemorrhage. Vascular: There are chronic calcified atherosclerotic changes of the large vessels at the skull base. No unexpected hyperdense vessel. Skull: Normal. Negative for fracture or focal lesion. Sinuses/Orbits: No acute finding. Other: Soft tissue edema/ hematoma overlying the lower left frontal bone. No underlying fracture. CT CERVICAL SPINE FINDINGS Alignment: Slight reversal of the normal cervical lordosis which is likely related to the underlying degenerative changes. Mild dextroscoliosis. No evidence of acute vertebral body subluxation. Skull base and vertebrae: No fracture line or displaced fracture fragment identified. Posterior facets appear intact and normally aligned throughout. Soft tissues and spinal canal: No prevertebral fluid or swelling. No visible canal hematoma. Disc levels: Degenerative changes throughout the cervical spine, moderate to severe in degree, with associated osseous spurring and disc space narrowing. 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 There are mild to moderate central canal stenosis at the remainder the levels. Additional degenerative hypertrophy of the uncovertebral and facet joints is causing moderate or severe neural foramen stenoses at the C3-4 through C6-7 levels with probable associated nerve root impingement. Upper chest: No acute findings. Other: Heavy atherosclerosis of the carotid arteries  bilaterally. Extensive degenerative change at the right shoulder, incompletely imaged. IMPRESSION: 1. Soft tissue edema/hematoma overlying the lower left frontal bone. No underlying fracture. 2. No acute intracranial abnormality. No intracranial hemorrhage or edema. 3. No fracture or acute subluxation identified within cervical spine. 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 4. Carotid atherosclerosis. Electronically Signed   By: Bary Richard M.D.   On: 09/01/2016 10:03   Ct Cervical Spine Wo Contrast  Result Date: 09/01/2016 CLINICAL DATA:  Larey Seat to left side, hematoma to left upper frontal side EXAM: CT HEAD WITHOUT CONTRAST CT CERVICAL SPINE WITHOUT CONTRAST TECHNIQUE: Multidetector CT imaging of the head and cervical spine was performed following the standard protocol without intravenous contrast. Multiplanar CT image reconstructions of the cervical spine were also generated. COMPARISON:  None. FINDINGS: CT HEAD FINDINGS Brain: There is generalized parenchymal atrophy with commensurate dilatation of the ventricles and sulci. There is no mass, hemorrhage, edema or other evidence of acute parenchymal abnormality. No extra-axial hemorrhage. Vascular: There are chronic calcified atherosclerotic changes of the large vessels at the skull base. No unexpected hyperdense vessel. Skull: Normal. Negative for fracture or focal lesion. Sinuses/Orbits: No acute finding. Other: Soft tissue edema/ hematoma overlying the lower left frontal bone. No underlying fracture. CT CERVICAL SPINE FINDINGS Alignment: Slight reversal of the normal cervical lordosis which is likely related to the underlying degenerative changes. Mild dextroscoliosis. No evidence of acute vertebral body subluxation. Skull base and vertebrae: No fracture line or  displaced fracture fragment identified. Posterior facets appear intact and normally aligned throughout. Soft tissues and spinal canal: No prevertebral fluid or  swelling. No visible canal hematoma. Disc levels: Degenerative changes throughout the cervical spine, moderate to severe in degree, with associated osseous spurring and disc space narrowing. 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 There are mild to moderate central canal stenosis at the remainder the levels. Additional degenerative hypertrophy of the uncovertebral and facet joints is causing moderate or severe neural foramen stenoses at the C3-4 through C6-7 levels with probable associated nerve root impingement. Upper chest: No acute findings. Other: Heavy atherosclerosis of the carotid arteries bilaterally. Extensive degenerative change at the right shoulder, incompletely imaged. IMPRESSION: 1. Soft tissue edema/hematoma overlying the lower left frontal bone. No underlying fracture. 2. No acute intracranial abnormality. No intracranial hemorrhage or edema. 3. No fracture or acute subluxation identified within cervical spine. 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 4. Carotid atherosclerosis. Electronically Signed   By: Bary RichardStan  Maynard M.D.   On: 09/01/2016 10:03   Echo: 09/02/2016:  - Left ventricle: The cavity size was normal. Systolic function was   normal. The estimated ejection fraction was in the range of 55%   to 60%. Wall motion was normal; there were no regional wall   motion abnormalities. Doppler parameters are consistent with   abnormal left ventricular relaxation (grade 1 diastolic   dysfunction). - Aortic valve: The valve is calcified and thickened with reduced   leaftet motion. THe gradient may be underestimated and suggest   clinical correlation with symptoms as valve visually appears more   stenotic. Cusp separation was reduced. There was trivial   regurgitation. Valve area (VTI): 1.32 cm^2. Valve area  (Vmax):   1.38 cm^2. Valve area (Vmean): 1.25 cm^2. - Aortic root: The aortic root was mildly dilated. - Mitral valve: There was mild to moderate regurgitation. Valve   area by pressure half-time: 1.83 cm^2. Valve area by continuity   equation (using LVOT flow): 1.19 cm^2. - Left atrium: The atrium was mildly dilated. - Pulmonary arteries: Systolic pressure was mildly increased. PA   peak pressure: 34 mm Hg (S).  Subjective: Patient seen and examined at bedside. He denies any active chest pain, palpitations, nausea, vomiting. He wants to go home. Discharge Exam: Vitals:   09/02/16 2010 09/03/16 0700  BP: (!) 134/54 (!) 131/53  Pulse: (!) 57 (!) 57  Resp: 20 18  Temp: 98.2 F (36.8 C) 97.5 F (36.4 C)   Vitals:   09/02/16 0552 09/02/16 1300 09/02/16 2010 09/03/16 0700  BP: 139/69 (!) 116/52 (!) 134/54 (!) 131/53  Pulse: (!) 56 (!) 56 (!) 57 (!) 57  Resp: 18 20 20 18   Temp: 98.1 F (36.7 C) 97.8 F (36.6 C) 98.2 F (36.8 C) 97.5 F (36.4 C)  TempSrc: Oral Oral Oral Oral  SpO2: 96% 99% 98% 100%  Weight: 72 kg (158 lb 12.8 oz)   71.4 kg (157 lb 8 oz)  Height:        General: Pt is alert, awake, not in acute distress Cardiovascular: Bradycardic, S1/S2 +, Respiratory: Bilateral decreased breath sound at bases Abdominal: Soft, NT, ND, bowel sounds + Extremities: no edema, no cyanosis    The results of significant diagnostics from this hospitalization (including imaging, microbiology, ancillary and laboratory) are listed below for reference.     Microbiology: No  results found for this or any previous visit (from the past 240 hour(s)).   Labs: BNP (last 3 results) No results for input(s): BNP in the last 8760 hours. Basic Metabolic Panel:  Recent Labs Lab 09/01/16 0846 09/02/16 0411 09/03/16 0455  NA 138 138 140  K 4.0 3.7 3.6  CL 104 105 104  CO2 27 26 26   GLUCOSE 106* 98 106*  BUN 31* 25* 21*  CREATININE 1.10 1.06 1.05  CALCIUM 9.6 9.2 9.1  MG  --   --   1.6*   Liver Function Tests: No results for input(s): AST, ALT, ALKPHOS, BILITOT, PROT, ALBUMIN in the last 168 hours. No results for input(s): LIPASE, AMYLASE in the last 168 hours. No results for input(s): AMMONIA in the last 168 hours. CBC:  Recent Labs Lab 09/01/16 0846  WBC 6.5  NEUTROABS 4.4  HGB 11.2*  HCT 34.3*  MCV 98.0  PLT 178   Cardiac Enzymes:  Recent Labs Lab 09/01/16 1827  TROPONINI <0.03   BNP: Invalid input(s): POCBNP CBG:  Recent Labs Lab 09/01/16 0912  GLUCAP 93   D-Dimer No results for input(s): DDIMER in the last 72 hours. Hgb A1c No results for input(s): HGBA1C in the last 72 hours. Lipid Profile No results for input(s): CHOL, HDL, LDLCALC, TRIG, CHOLHDL, LDLDIRECT in the last 72 hours. Thyroid function studies  Recent Labs  09/01/16 1055  TSH 1.926   Anemia work up  Recent Labs  09/01/16 1055  VITAMINB12 3,251*  FOLATE 39.6  FERRITIN 131  TIBC 304  IRON 89  RETICCTPCT 1.3   Urinalysis    Component Value Date/Time   COLORURINE YELLOW 09/01/2016 1105   APPEARANCEUR CLEAR 09/01/2016 1105   LABSPEC 1.012 09/01/2016 1105   PHURINE 6.0 09/01/2016 1105   GLUCOSEU NEGATIVE 09/01/2016 1105   HGBUR NEGATIVE 09/01/2016 1105   BILIRUBINUR NEGATIVE 09/01/2016 1105   KETONESUR NEGATIVE 09/01/2016 1105   PROTEINUR NEGATIVE 09/01/2016 1105   NITRITE NEGATIVE 09/01/2016 1105   LEUKOCYTESUR NEGATIVE 09/01/2016 1105   Sepsis Labs Invalid input(s): PROCALCITONIN,  WBC,  LACTICIDVEN Microbiology No results found for this or any previous visit (from the past 240 hour(s)).   Time coordinating discharge: 35 minutes  SIGNED:   Glade Lloyd, MD  [facility] 09/03/2016, 9:09 AM Pager: [phone]  If 7PM-7AM, please contact night-coverage [email or web address] Password [email or web address]

## 2016-09-03 NOTE — Progress Notes (Signed)
MD returned page stated preliminary results are in, okay to discharge pt  Vernon Walton [name]

## 2016-09-03 NOTE — Progress Notes (Signed)
Pt refusing to go to echo for VAS US Carotid, stated no one told him he was going this morning and he never got to eat breakfast. Pt informed that he will be moved on the schedule to later this evening when echo has availability. MD in hall and aware of pt refusing. Pt wife informed pt that this will affect discharge, pt now agreeable to go.   Kaisley Stiverson [name]

## 2016-09-03 NOTE — Progress Notes (Signed)
VASCULAR LAB PRELIMINARY  PRELIMINARY  PRELIMINARY  PRELIMINARY  Carotid duplex completed.    Preliminary report:  1-39% ICA plaquing. Vertebral artery flow is antegrade.   Azari Hasler, RVT 09/03/2016, 9:00 AM

## 2016-09-03 NOTE — Progress Notes (Signed)
Pt walked with physical therapy, recommends a walker at discharge. Informed case management.   Vernon Walton

## 2016-09-03 NOTE — Progress Notes (Signed)
Pt IV removed, catheter intact and telemetry removed. Pt has all belongings packed up, home walker, and discharge paperwork. Awaiting pt ride for discharge  Vernon Walton [name]

## 2016-09-03 NOTE — Progress Notes (Addendum)
Pt discharged with [facility] via wheelchair and family member via wheelchair. Pt has all belongings including walker, cane and discharge paperwork  Vernon Walton [name]

## 2016-09-03 NOTE — Care Management Note (Signed)
Case Management Note  Patient Details  Name: Vernon ReaRMAND [name] Sr. MRN: [id number] Date of Birth: December 21, 1918  Subjective/Objective: Admitted with Syncope                 Action/Plan: Patient lives at home with spouse; PCP: Merri BrunettePharr, Walter, MD; has private insurance with Medicare; [facility] Corrie DandyMary with [facility] called for arrangements rolling walker ordered also  Expected Discharge Date:  09/03/16               Expected Discharge Plan:  Home w Home Health Services  Discharge planning Services  CM Consult  Choice offered to:  Patient, Spouse  DME Arranged:  Dan HumphreysWalker rolling DME Agency:  [facility]  [facility] Arr[facility]  [facility]  Status of Service:  In process, will continue to follow Reola MosherChandler, Eoghan Belcher L, RN,MHA,BSN [phone] 09/03/2016, 11:22 AM

## 2016-09-06 DIAGNOSIS — I519 Heart disease, unspecified: Secondary | ICD-10-CM | POA: Diagnosis not present

## 2016-09-06 DIAGNOSIS — Z9181 History of falling: Secondary | ICD-10-CM | POA: Diagnosis not present

## 2016-09-06 DIAGNOSIS — S0083XD Contusion of other part of head, subsequent encounter: Secondary | ICD-10-CM | POA: Diagnosis not present

## 2016-09-06 DIAGNOSIS — W19XXXD Unspecified fall, subsequent encounter: Secondary | ICD-10-CM | POA: Diagnosis not present

## 2016-09-06 DIAGNOSIS — I10 Essential (primary) hypertension: Secondary | ICD-10-CM | POA: Diagnosis not present

## 2016-09-06 DIAGNOSIS — M1990 Unspecified osteoarthritis, unspecified site: Secondary | ICD-10-CM | POA: Diagnosis not present

## 2016-09-10 DIAGNOSIS — I10 Essential (primary) hypertension: Secondary | ICD-10-CM | POA: Diagnosis not present

## 2016-09-10 DIAGNOSIS — K219 Gastro-esophageal reflux disease without esophagitis: Secondary | ICD-10-CM | POA: Diagnosis not present

## 2016-09-10 DIAGNOSIS — Z09 Encounter for follow-up examination after completed treatment for conditions other than malignant neoplasm: Secondary | ICD-10-CM | POA: Diagnosis not present

## 2016-09-10 DIAGNOSIS — E7800 Pure hypercholesterolemia, unspecified: Secondary | ICD-10-CM | POA: Diagnosis not present

## 2016-09-12 DIAGNOSIS — Z9181 History of falling: Secondary | ICD-10-CM | POA: Diagnosis not present

## 2016-09-12 DIAGNOSIS — W19XXXD Unspecified fall, subsequent encounter: Secondary | ICD-10-CM | POA: Diagnosis not present

## 2016-09-12 DIAGNOSIS — M19041 Primary osteoarthritis, right hand: Secondary | ICD-10-CM | POA: Diagnosis not present

## 2016-09-12 DIAGNOSIS — S0083XD Contusion of other part of head, subsequent encounter: Secondary | ICD-10-CM | POA: Diagnosis not present

## 2016-09-12 DIAGNOSIS — I10 Essential (primary) hypertension: Secondary | ICD-10-CM | POA: Diagnosis not present

## 2016-09-12 DIAGNOSIS — I519 Heart disease, unspecified: Secondary | ICD-10-CM | POA: Diagnosis not present

## 2016-09-12 DIAGNOSIS — M1990 Unspecified osteoarthritis, unspecified site: Secondary | ICD-10-CM | POA: Diagnosis not present

## 2016-09-12 DIAGNOSIS — M19042 Primary osteoarthritis, left hand: Secondary | ICD-10-CM | POA: Diagnosis not present

## 2016-09-18 DIAGNOSIS — I10 Essential (primary) hypertension: Secondary | ICD-10-CM | POA: Diagnosis not present

## 2016-09-18 DIAGNOSIS — Z9181 History of falling: Secondary | ICD-10-CM | POA: Diagnosis not present

## 2016-09-18 DIAGNOSIS — S0083XD Contusion of other part of head, subsequent encounter: Secondary | ICD-10-CM | POA: Diagnosis not present

## 2016-09-18 DIAGNOSIS — W19XXXD Unspecified fall, subsequent encounter: Secondary | ICD-10-CM | POA: Diagnosis not present

## 2016-09-18 DIAGNOSIS — M1990 Unspecified osteoarthritis, unspecified site: Secondary | ICD-10-CM | POA: Diagnosis not present

## 2016-09-18 DIAGNOSIS — I519 Heart disease, unspecified: Secondary | ICD-10-CM | POA: Diagnosis not present

## 2016-09-24 DIAGNOSIS — S0083XD Contusion of other part of head, subsequent encounter: Secondary | ICD-10-CM | POA: Diagnosis not present

## 2016-09-24 DIAGNOSIS — W19XXXD Unspecified fall, subsequent encounter: Secondary | ICD-10-CM | POA: Diagnosis not present

## 2016-09-24 DIAGNOSIS — Z9181 History of falling: Secondary | ICD-10-CM | POA: Diagnosis not present

## 2016-09-24 DIAGNOSIS — I519 Heart disease, unspecified: Secondary | ICD-10-CM | POA: Diagnosis not present

## 2016-09-24 DIAGNOSIS — M1990 Unspecified osteoarthritis, unspecified site: Secondary | ICD-10-CM | POA: Diagnosis not present

## 2016-09-24 DIAGNOSIS — I10 Essential (primary) hypertension: Secondary | ICD-10-CM | POA: Diagnosis not present

## 2016-09-26 DIAGNOSIS — Z9181 History of falling: Secondary | ICD-10-CM | POA: Diagnosis not present

## 2016-09-26 DIAGNOSIS — I519 Heart disease, unspecified: Secondary | ICD-10-CM | POA: Diagnosis not present

## 2016-09-26 DIAGNOSIS — M1990 Unspecified osteoarthritis, unspecified site: Secondary | ICD-10-CM | POA: Diagnosis not present

## 2016-09-26 DIAGNOSIS — I10 Essential (primary) hypertension: Secondary | ICD-10-CM | POA: Diagnosis not present

## 2016-09-26 DIAGNOSIS — W19XXXD Unspecified fall, subsequent encounter: Secondary | ICD-10-CM | POA: Diagnosis not present

## 2016-09-26 DIAGNOSIS — S0083XD Contusion of other part of head, subsequent encounter: Secondary | ICD-10-CM | POA: Diagnosis not present

## 2016-10-03 DIAGNOSIS — S0083XD Contusion of other part of head, subsequent encounter: Secondary | ICD-10-CM | POA: Diagnosis not present

## 2016-10-03 DIAGNOSIS — Z9181 History of falling: Secondary | ICD-10-CM | POA: Diagnosis not present

## 2016-10-03 DIAGNOSIS — I10 Essential (primary) hypertension: Secondary | ICD-10-CM | POA: Diagnosis not present

## 2016-10-03 DIAGNOSIS — I519 Heart disease, unspecified: Secondary | ICD-10-CM | POA: Diagnosis not present

## 2016-10-03 DIAGNOSIS — W19XXXD Unspecified fall, subsequent encounter: Secondary | ICD-10-CM | POA: Diagnosis not present

## 2016-10-03 DIAGNOSIS — M1990 Unspecified osteoarthritis, unspecified site: Secondary | ICD-10-CM | POA: Diagnosis not present

## 2016-10-05 DIAGNOSIS — I10 Essential (primary) hypertension: Secondary | ICD-10-CM | POA: Diagnosis not present

## 2016-10-05 DIAGNOSIS — Z9181 History of falling: Secondary | ICD-10-CM | POA: Diagnosis not present

## 2016-10-05 DIAGNOSIS — I519 Heart disease, unspecified: Secondary | ICD-10-CM | POA: Diagnosis not present

## 2016-10-05 DIAGNOSIS — W19XXXD Unspecified fall, subsequent encounter: Secondary | ICD-10-CM | POA: Diagnosis not present

## 2016-10-05 DIAGNOSIS — S0083XD Contusion of other part of head, subsequent encounter: Secondary | ICD-10-CM | POA: Diagnosis not present

## 2016-10-05 DIAGNOSIS — M1990 Unspecified osteoarthritis, unspecified site: Secondary | ICD-10-CM | POA: Diagnosis not present

## 2016-10-09 DIAGNOSIS — S0083XD Contusion of other part of head, subsequent encounter: Secondary | ICD-10-CM | POA: Diagnosis not present

## 2016-10-09 DIAGNOSIS — Z9181 History of falling: Secondary | ICD-10-CM | POA: Diagnosis not present

## 2016-10-09 DIAGNOSIS — I10 Essential (primary) hypertension: Secondary | ICD-10-CM | POA: Diagnosis not present

## 2016-10-09 DIAGNOSIS — I519 Heart disease, unspecified: Secondary | ICD-10-CM | POA: Diagnosis not present

## 2016-10-09 DIAGNOSIS — M1990 Unspecified osteoarthritis, unspecified site: Secondary | ICD-10-CM | POA: Diagnosis not present

## 2016-10-09 DIAGNOSIS — W19XXXD Unspecified fall, subsequent encounter: Secondary | ICD-10-CM | POA: Diagnosis not present

## 2016-10-12 DIAGNOSIS — I10 Essential (primary) hypertension: Secondary | ICD-10-CM | POA: Diagnosis not present

## 2016-10-12 DIAGNOSIS — S0083XD Contusion of other part of head, subsequent encounter: Secondary | ICD-10-CM | POA: Diagnosis not present

## 2016-10-12 DIAGNOSIS — Z9181 History of falling: Secondary | ICD-10-CM | POA: Diagnosis not present

## 2016-10-12 DIAGNOSIS — M1990 Unspecified osteoarthritis, unspecified site: Secondary | ICD-10-CM | POA: Diagnosis not present

## 2016-10-12 DIAGNOSIS — I519 Heart disease, unspecified: Secondary | ICD-10-CM | POA: Diagnosis not present

## 2016-10-12 DIAGNOSIS — W19XXXD Unspecified fall, subsequent encounter: Secondary | ICD-10-CM | POA: Diagnosis not present

## 2016-10-16 DIAGNOSIS — I10 Essential (primary) hypertension: Secondary | ICD-10-CM | POA: Diagnosis not present

## 2016-10-16 DIAGNOSIS — M6281 Muscle weakness (generalized): Secondary | ICD-10-CM | POA: Diagnosis not present

## 2016-10-16 DIAGNOSIS — M19041 Primary osteoarthritis, right hand: Secondary | ICD-10-CM | POA: Diagnosis not present

## 2016-10-19 DIAGNOSIS — S0083XD Contusion of other part of head, subsequent encounter: Secondary | ICD-10-CM | POA: Diagnosis not present

## 2016-10-19 DIAGNOSIS — M1990 Unspecified osteoarthritis, unspecified site: Secondary | ICD-10-CM | POA: Diagnosis not present

## 2016-10-19 DIAGNOSIS — I10 Essential (primary) hypertension: Secondary | ICD-10-CM | POA: Diagnosis not present

## 2016-10-19 DIAGNOSIS — I519 Heart disease, unspecified: Secondary | ICD-10-CM | POA: Diagnosis not present

## 2016-10-19 DIAGNOSIS — W19XXXD Unspecified fall, subsequent encounter: Secondary | ICD-10-CM | POA: Diagnosis not present

## 2016-10-19 DIAGNOSIS — Z9181 History of falling: Secondary | ICD-10-CM | POA: Diagnosis not present

## 2016-10-23 DIAGNOSIS — I10 Essential (primary) hypertension: Secondary | ICD-10-CM | POA: Diagnosis not present

## 2016-10-23 DIAGNOSIS — S0083XD Contusion of other part of head, subsequent encounter: Secondary | ICD-10-CM | POA: Diagnosis not present

## 2016-10-23 DIAGNOSIS — M1990 Unspecified osteoarthritis, unspecified site: Secondary | ICD-10-CM | POA: Diagnosis not present

## 2016-10-23 DIAGNOSIS — W19XXXD Unspecified fall, subsequent encounter: Secondary | ICD-10-CM | POA: Diagnosis not present

## 2016-10-23 DIAGNOSIS — I519 Heart disease, unspecified: Secondary | ICD-10-CM | POA: Diagnosis not present

## 2016-10-23 DIAGNOSIS — Z9181 History of falling: Secondary | ICD-10-CM | POA: Diagnosis not present

## 2016-10-26 DIAGNOSIS — W19XXXD Unspecified fall, subsequent encounter: Secondary | ICD-10-CM | POA: Diagnosis not present

## 2016-10-26 DIAGNOSIS — Z9181 History of falling: Secondary | ICD-10-CM | POA: Diagnosis not present

## 2016-10-26 DIAGNOSIS — I519 Heart disease, unspecified: Secondary | ICD-10-CM | POA: Diagnosis not present

## 2016-10-26 DIAGNOSIS — I10 Essential (primary) hypertension: Secondary | ICD-10-CM | POA: Diagnosis not present

## 2016-10-26 DIAGNOSIS — S0083XD Contusion of other part of head, subsequent encounter: Secondary | ICD-10-CM | POA: Diagnosis not present

## 2016-10-26 DIAGNOSIS — M1990 Unspecified osteoarthritis, unspecified site: Secondary | ICD-10-CM | POA: Diagnosis not present

## 2016-10-30 DIAGNOSIS — Z9181 History of falling: Secondary | ICD-10-CM | POA: Diagnosis not present

## 2016-10-30 DIAGNOSIS — W19XXXD Unspecified fall, subsequent encounter: Secondary | ICD-10-CM | POA: Diagnosis not present

## 2016-10-30 DIAGNOSIS — I519 Heart disease, unspecified: Secondary | ICD-10-CM | POA: Diagnosis not present

## 2016-10-30 DIAGNOSIS — M1990 Unspecified osteoarthritis, unspecified site: Secondary | ICD-10-CM | POA: Diagnosis not present

## 2016-10-30 DIAGNOSIS — I10 Essential (primary) hypertension: Secondary | ICD-10-CM | POA: Diagnosis not present

## 2016-10-30 DIAGNOSIS — S0083XD Contusion of other part of head, subsequent encounter: Secondary | ICD-10-CM | POA: Diagnosis not present

## 2016-11-02 DIAGNOSIS — Z9181 History of falling: Secondary | ICD-10-CM | POA: Diagnosis not present

## 2016-11-02 DIAGNOSIS — M1990 Unspecified osteoarthritis, unspecified site: Secondary | ICD-10-CM | POA: Diagnosis not present

## 2016-11-02 DIAGNOSIS — S0083XD Contusion of other part of head, subsequent encounter: Secondary | ICD-10-CM | POA: Diagnosis not present

## 2016-11-02 DIAGNOSIS — I519 Heart disease, unspecified: Secondary | ICD-10-CM | POA: Diagnosis not present

## 2016-11-02 DIAGNOSIS — I10 Essential (primary) hypertension: Secondary | ICD-10-CM | POA: Diagnosis not present

## 2016-11-02 DIAGNOSIS — W19XXXD Unspecified fall, subsequent encounter: Secondary | ICD-10-CM | POA: Diagnosis not present

## 2016-11-15 DIAGNOSIS — M2550 Pain in unspecified joint: Secondary | ICD-10-CM | POA: Diagnosis not present

## 2016-11-15 DIAGNOSIS — I10 Essential (primary) hypertension: Secondary | ICD-10-CM | POA: Diagnosis not present

## 2016-12-04 DIAGNOSIS — I10 Essential (primary) hypertension: Secondary | ICD-10-CM | POA: Diagnosis not present

## 2017-01-02 DIAGNOSIS — Z23 Encounter for immunization: Secondary | ICD-10-CM | POA: Diagnosis not present

## 2017-01-02 DIAGNOSIS — I10 Essential (primary) hypertension: Secondary | ICD-10-CM | POA: Diagnosis not present

## 2017-01-02 DIAGNOSIS — M545 Low back pain: Secondary | ICD-10-CM | POA: Diagnosis not present

## 2017-02-12 DIAGNOSIS — I10 Essential (primary) hypertension: Secondary | ICD-10-CM | POA: Diagnosis not present

## 2017-02-12 DIAGNOSIS — M545 Low back pain: Secondary | ICD-10-CM | POA: Diagnosis not present

## 2017-03-21 DIAGNOSIS — I10 Essential (primary) hypertension: Secondary | ICD-10-CM | POA: Diagnosis not present

## 2017-03-21 DIAGNOSIS — M545 Low back pain: Secondary | ICD-10-CM | POA: Diagnosis not present

## 2017-05-16 DIAGNOSIS — Z0000 Encounter for general adult medical examination without abnormal findings: Secondary | ICD-10-CM | POA: Diagnosis not present

## 2017-05-20 DIAGNOSIS — I10 Essential (primary) hypertension: Secondary | ICD-10-CM | POA: Diagnosis not present

## 2017-05-20 DIAGNOSIS — K219 Gastro-esophageal reflux disease without esophagitis: Secondary | ICD-10-CM | POA: Diagnosis not present

## 2017-05-20 DIAGNOSIS — E7800 Pure hypercholesterolemia, unspecified: Secondary | ICD-10-CM | POA: Diagnosis not present

## 2017-05-28 DIAGNOSIS — L570 Actinic keratosis: Secondary | ICD-10-CM | POA: Diagnosis not present

## 2017-05-28 DIAGNOSIS — I440 Atrioventricular block, first degree: Secondary | ICD-10-CM | POA: Diagnosis not present

## 2017-05-28 DIAGNOSIS — E538 Deficiency of other specified B group vitamins: Secondary | ICD-10-CM | POA: Diagnosis not present

## 2017-05-28 DIAGNOSIS — N281 Cyst of kidney, acquired: Secondary | ICD-10-CM | POA: Diagnosis not present

## 2017-05-28 DIAGNOSIS — M545 Low back pain: Secondary | ICD-10-CM | POA: Diagnosis not present

## 2017-05-28 DIAGNOSIS — E7800 Pure hypercholesterolemia, unspecified: Secondary | ICD-10-CM | POA: Diagnosis not present

## 2017-05-28 DIAGNOSIS — G629 Polyneuropathy, unspecified: Secondary | ICD-10-CM | POA: Diagnosis not present

## 2017-05-28 DIAGNOSIS — K219 Gastro-esophageal reflux disease without esophagitis: Secondary | ICD-10-CM | POA: Diagnosis not present

## 2017-05-28 DIAGNOSIS — K140 Glossitis: Secondary | ICD-10-CM | POA: Diagnosis not present

## 2017-05-28 DIAGNOSIS — B351 Tinea unguium: Secondary | ICD-10-CM | POA: Diagnosis not present

## 2017-05-28 DIAGNOSIS — E119 Type 2 diabetes mellitus without complications: Secondary | ICD-10-CM | POA: Diagnosis not present

## 2017-05-28 DIAGNOSIS — I10 Essential (primary) hypertension: Secondary | ICD-10-CM | POA: Diagnosis not present

## 2017-10-01 DIAGNOSIS — E7800 Pure hypercholesterolemia, unspecified: Secondary | ICD-10-CM | POA: Diagnosis not present

## 2017-10-01 DIAGNOSIS — F329 Major depressive disorder, single episode, unspecified: Secondary | ICD-10-CM | POA: Diagnosis not present

## 2017-10-01 DIAGNOSIS — I10 Essential (primary) hypertension: Secondary | ICD-10-CM | POA: Diagnosis not present

## 2017-10-01 DIAGNOSIS — E119 Type 2 diabetes mellitus without complications: Secondary | ICD-10-CM | POA: Diagnosis not present

## 2017-10-01 DIAGNOSIS — K219 Gastro-esophageal reflux disease without esophagitis: Secondary | ICD-10-CM | POA: Diagnosis not present

## 2017-12-27 DIAGNOSIS — E7800 Pure hypercholesterolemia, unspecified: Secondary | ICD-10-CM | POA: Diagnosis not present

## 2017-12-27 DIAGNOSIS — E118 Type 2 diabetes mellitus with unspecified complications: Secondary | ICD-10-CM | POA: Diagnosis not present

## 2017-12-31 DIAGNOSIS — E7800 Pure hypercholesterolemia, unspecified: Secondary | ICD-10-CM | POA: Diagnosis not present

## 2017-12-31 DIAGNOSIS — I10 Essential (primary) hypertension: Secondary | ICD-10-CM | POA: Diagnosis not present

## 2017-12-31 DIAGNOSIS — K219 Gastro-esophageal reflux disease without esophagitis: Secondary | ICD-10-CM | POA: Diagnosis not present

## 2017-12-31 DIAGNOSIS — Z23 Encounter for immunization: Secondary | ICD-10-CM | POA: Diagnosis not present

## 2017-12-31 DIAGNOSIS — E119 Type 2 diabetes mellitus without complications: Secondary | ICD-10-CM | POA: Diagnosis not present

## 2017-12-31 DIAGNOSIS — M159 Polyosteoarthritis, unspecified: Secondary | ICD-10-CM | POA: Diagnosis not present

## 2017-12-31 DIAGNOSIS — F329 Major depressive disorder, single episode, unspecified: Secondary | ICD-10-CM | POA: Diagnosis not present

## 2018-04-01 DIAGNOSIS — F329 Major depressive disorder, single episode, unspecified: Secondary | ICD-10-CM | POA: Diagnosis not present

## 2018-04-01 DIAGNOSIS — I10 Essential (primary) hypertension: Secondary | ICD-10-CM | POA: Diagnosis not present

## 2018-04-01 DIAGNOSIS — E7800 Pure hypercholesterolemia, unspecified: Secondary | ICD-10-CM | POA: Diagnosis not present

## 2018-04-01 DIAGNOSIS — M545 Low back pain: Secondary | ICD-10-CM | POA: Diagnosis not present

## 2018-04-01 DIAGNOSIS — K219 Gastro-esophageal reflux disease without esophagitis: Secondary | ICD-10-CM | POA: Diagnosis not present

## 2018-05-04 IMAGING — CT CT CERVICAL SPINE W/O CM
4 of 7 series · 14 of 33 positions shown, 15 images · non-contrast
Comparison: None.

CLINICAL DATA: Fell to left side, hematoma to left upper frontal
side

EXAM:
CT HEAD WITHOUT CONTRAST
CT CERVICAL SPINE WITHOUT CONTRAST
TECHNIQUE: Multidetector CT imaging of the head and cervical spine was
performed following the standard protocol without intravenous
contrast. Multiplanar CT image reconstructions of the cervical spine
were also generated.

[Series 5: head bone · axial · 0.48mm/px · z∈[+1071,+1175]mm · 4 of 88 slices shown]
[im 18/88  bone]
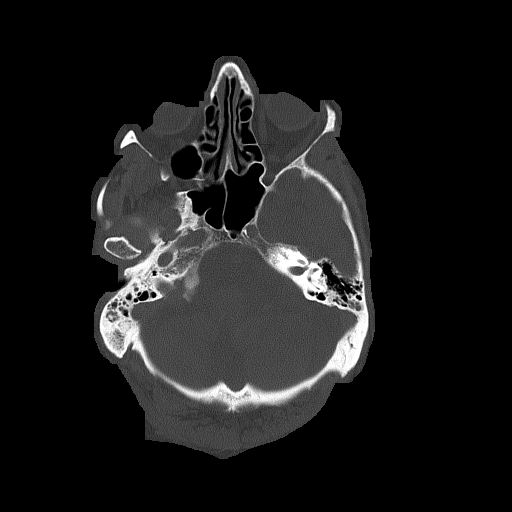
[im 35/88  bone]
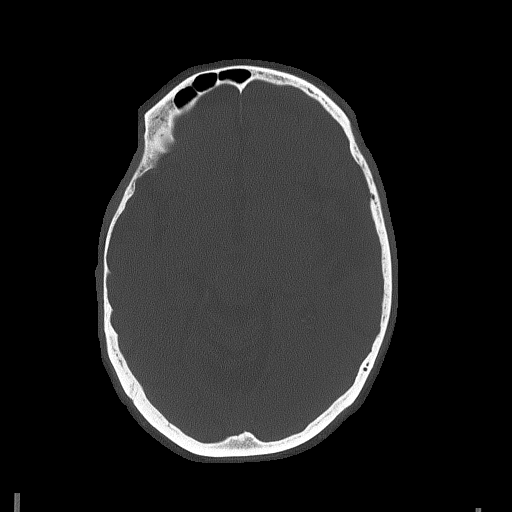
[im 53/88  bone]
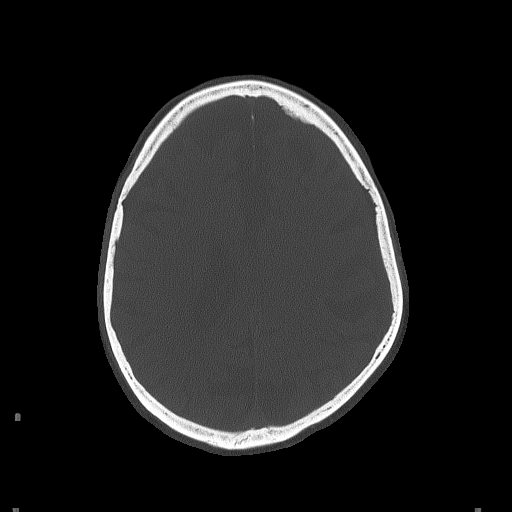
[im 70/88  bone]
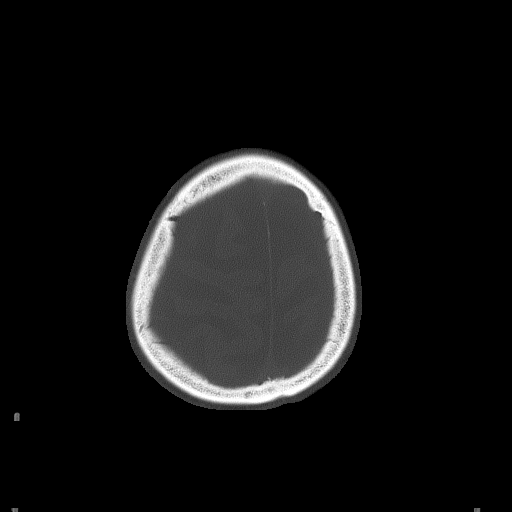

[Series 8: c_spine 2.0 st · axial · 0.52mm/px · z∈[+956,+1022]mm · 3 of 67 slices shown, 4 images]
[im 17/67  soft-tissue]
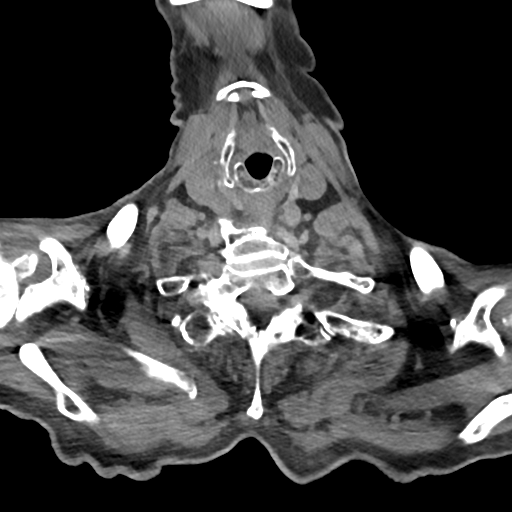
[im 17/67  bone]
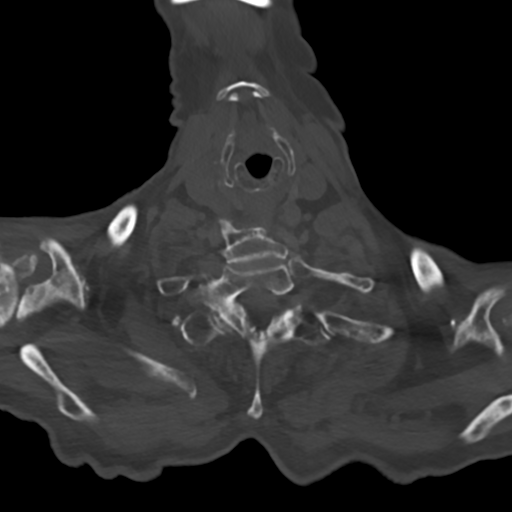
[im 34/67  bone]
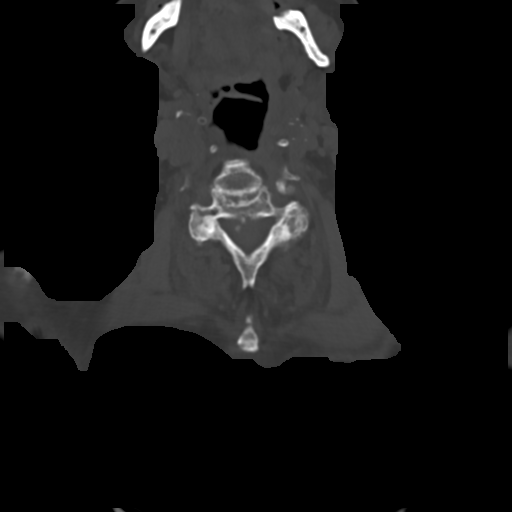
[im 50/67  bone]
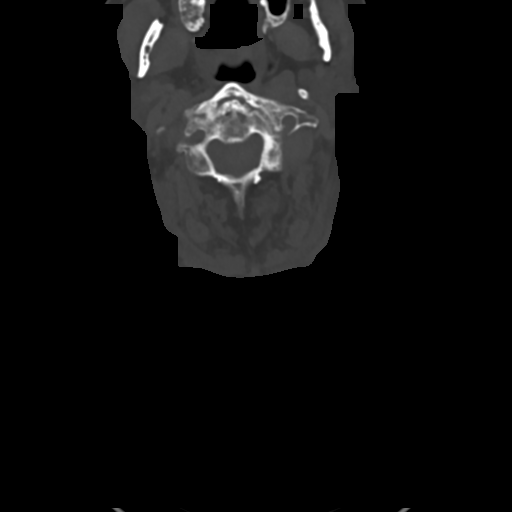

[Series 10: c_spine 2.0 sag bone · sagittal · 0.32mm/px · 5 of 100 slices shown]
[im 15/100  bone]
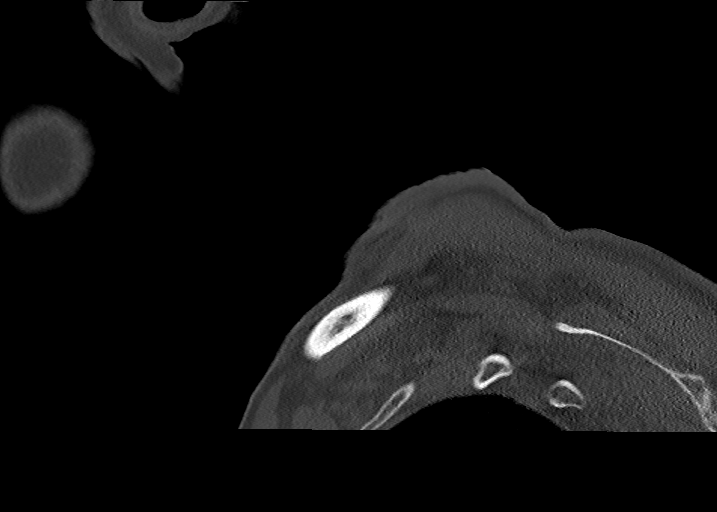
[im 29/100  bone]
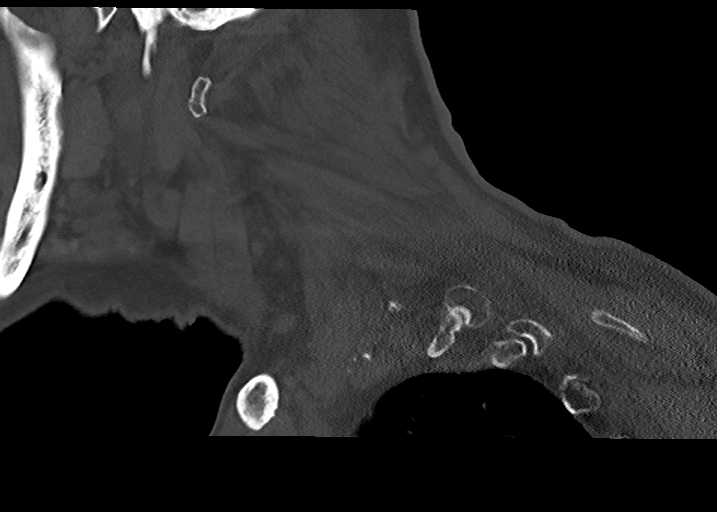
[im 43/100  bone]
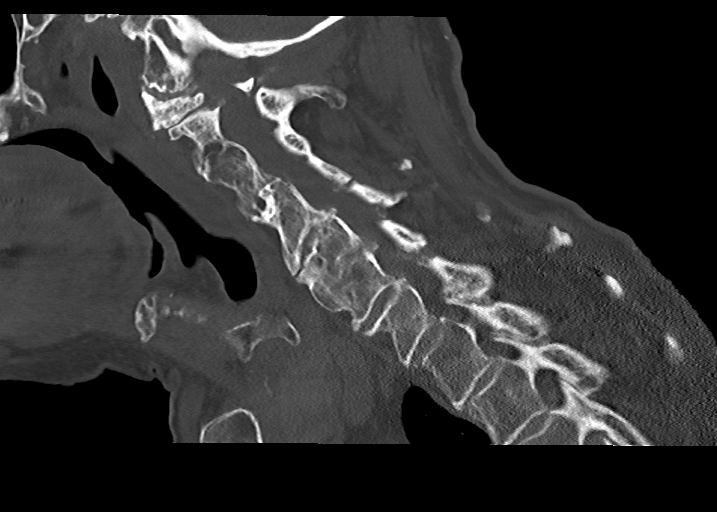
[im 57/100  bone]
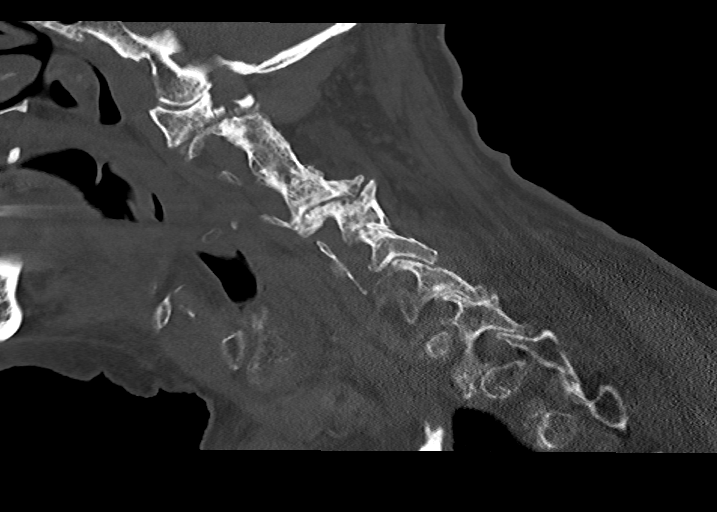
[im 71/100  bone]
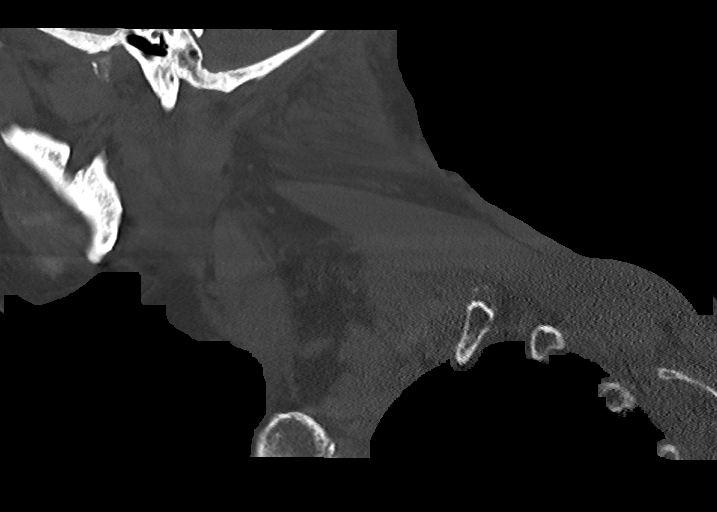

[Series 15: c_spine 2.0 cor bone · coronal · 0.31mm/px · 2 of 119 slices shown]
[im 40/119  bone]
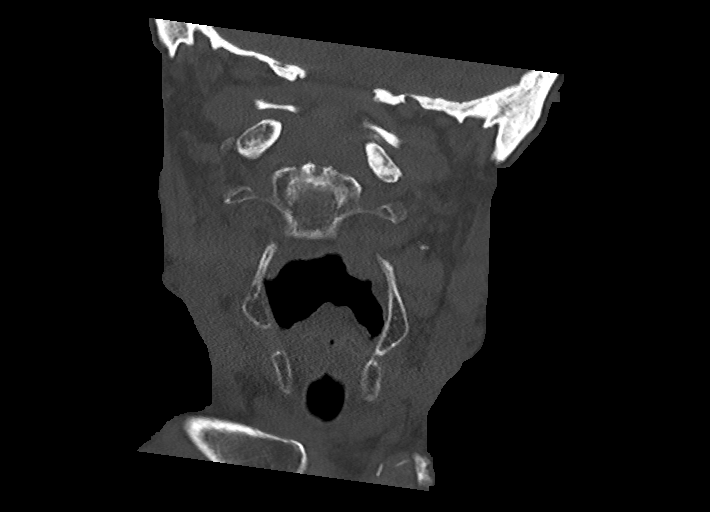
[im 79/119  bone]
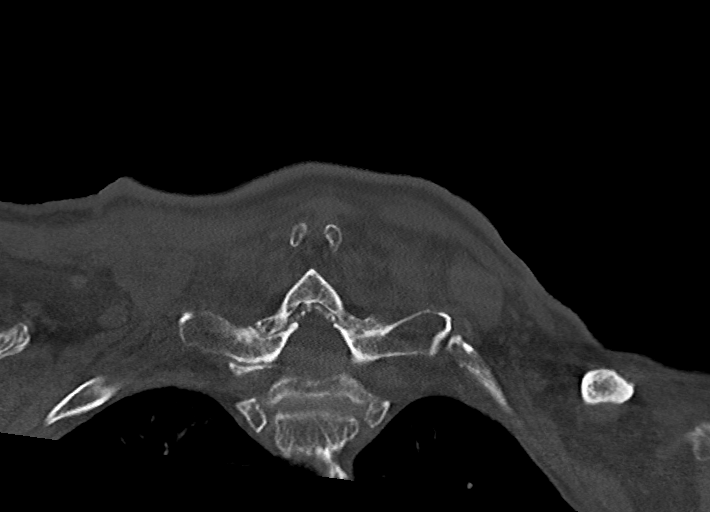

[14 of 33 positions shown; findings below may reference images not displayed]

FINDINGS: CT HEAD FINDINGS

Brain: There is generalized parenchymal atrophy with commensurate
dilatation of the ventricles and sulci. There is no mass,
hemorrhage, edema or other evidence of acute parenchymal
abnormality. No extra-axial hemorrhage.

Vascular: There are chronic calcified atherosclerotic changes of the
large vessels at the skull base. No unexpected hyperdense vessel.

Skull: Normal. Negative for fracture or focal lesion.

Sinuses/Orbits: No acute finding.

Other: Soft tissue edema/ hematoma overlying the lower left frontal
bone. No underlying fracture.

CT CERVICAL SPINE FINDINGS

Alignment: Slight reversal of the normal cervical lordosis which is
likely related to the underlying degenerative changes. Mild
dextroscoliosis. No evidence of acute vertebral body subluxation.

Skull base and vertebrae: No fracture line or displaced fracture
fragment identified. Posterior facets appear intact and normally
aligned throughout.

Soft tissues and spinal canal: No prevertebral fluid or swelling. No
visible canal hematoma.

Disc levels: Degenerative changes throughout the cervical spine,
moderate to severe in degree, with associated osseous spurring and
disc space narrowing. Most prominent disc-osteophytic bulge, with
overlying ossification of the posterior longitudinal ligament, is
seen at the C5-6 level resulting in moderate to severe central canal
stenosis. There are mild to moderate central canal stenosis at the
remainder the levels. Additional degenerative hypertrophy of the
uncovertebral and facet joints is causing moderate or severe neural
foramen stenoses at the C3-4 through C6-7 levels with probable
associated nerve root impingement.

Upper chest: No acute findings.

Other: Heavy atherosclerosis of the carotid arteries bilaterally.
Extensive degenerative change at the right shoulder, incompletely
imaged.
IMPRESSION: 1. Soft tissue edema/hematoma overlying the lower left frontal bone.
No underlying fracture.
2. No acute intracranial abnormality. No intracranial hemorrhage or
edema.
3. No fracture or acute subluxation identified within cervical
spine. Extensive degenerative changes of the cervical spine, as
detailed above.
4. Carotid atherosclerosis.

## 2018-06-03 DIAGNOSIS — F439 Reaction to severe stress, unspecified: Secondary | ICD-10-CM | POA: Diagnosis not present

## 2018-06-03 DIAGNOSIS — M159 Polyosteoarthritis, unspecified: Secondary | ICD-10-CM | POA: Diagnosis not present

## 2018-06-03 DIAGNOSIS — E118 Type 2 diabetes mellitus with unspecified complications: Secondary | ICD-10-CM | POA: Diagnosis not present

## 2018-06-03 DIAGNOSIS — K219 Gastro-esophageal reflux disease without esophagitis: Secondary | ICD-10-CM | POA: Diagnosis not present

## 2018-06-03 DIAGNOSIS — B351 Tinea unguium: Secondary | ICD-10-CM | POA: Diagnosis not present

## 2018-06-03 DIAGNOSIS — L570 Actinic keratosis: Secondary | ICD-10-CM | POA: Diagnosis not present

## 2018-06-03 DIAGNOSIS — I10 Essential (primary) hypertension: Secondary | ICD-10-CM | POA: Diagnosis not present

## 2018-06-03 DIAGNOSIS — F329 Major depressive disorder, single episode, unspecified: Secondary | ICD-10-CM | POA: Diagnosis not present

## 2018-06-03 DIAGNOSIS — N400 Enlarged prostate without lower urinary tract symptoms: Secondary | ICD-10-CM | POA: Diagnosis not present

## 2018-06-03 DIAGNOSIS — R634 Abnormal weight loss: Secondary | ICD-10-CM | POA: Diagnosis not present

## 2018-06-03 DIAGNOSIS — E7800 Pure hypercholesterolemia, unspecified: Secondary | ICD-10-CM | POA: Diagnosis not present

## 2018-07-31 DIAGNOSIS — I10 Essential (primary) hypertension: Secondary | ICD-10-CM | POA: Diagnosis not present

## 2018-07-31 DIAGNOSIS — M545 Low back pain: Secondary | ICD-10-CM | POA: Diagnosis not present

## 2018-07-31 DIAGNOSIS — E7800 Pure hypercholesterolemia, unspecified: Secondary | ICD-10-CM | POA: Diagnosis not present

## 2018-07-31 DIAGNOSIS — K219 Gastro-esophageal reflux disease without esophagitis: Secondary | ICD-10-CM | POA: Diagnosis not present

## 2018-07-31 DIAGNOSIS — F329 Major depressive disorder, single episode, unspecified: Secondary | ICD-10-CM | POA: Diagnosis not present

## 2018-11-20 ENCOUNTER — Other Ambulatory Visit: Payer: Self-pay

## 2018-12-09 DIAGNOSIS — I10 Essential (primary) hypertension: Secondary | ICD-10-CM | POA: Diagnosis not present

## 2018-12-09 DIAGNOSIS — M545 Low back pain: Secondary | ICD-10-CM | POA: Diagnosis not present

## 2018-12-09 DIAGNOSIS — F329 Major depressive disorder, single episode, unspecified: Secondary | ICD-10-CM | POA: Diagnosis not present

## 2018-12-09 DIAGNOSIS — K219 Gastro-esophageal reflux disease without esophagitis: Secondary | ICD-10-CM | POA: Diagnosis not present

## 2019-04-05 ENCOUNTER — Other Ambulatory Visit: Payer: Self-pay

## 2019-04-05 ENCOUNTER — Encounter (HOSPITAL_COMMUNITY): Payer: Self-pay | Admitting: Emergency Medicine

## 2019-04-05 ENCOUNTER — Emergency Department (HOSPITAL_COMMUNITY)
Admission: EM | Admit: 2019-04-05 | Discharge: 2019-04-05 | Disposition: A | Discharge status: Home / Self Care | Payer: Medicare Other | Attending: Emergency Medicine | Admitting: Emergency Medicine

## 2019-04-05 DIAGNOSIS — M79621 Pain in right upper arm: Secondary | ICD-10-CM | POA: Diagnosis not present

## 2019-04-05 DIAGNOSIS — R2231 Localized swelling, mass and lump, right upper limb: Secondary | ICD-10-CM | POA: Diagnosis not present

## 2019-04-05 DIAGNOSIS — K5641 Fecal impaction: Secondary | ICD-10-CM | POA: Diagnosis not present

## 2019-04-05 DIAGNOSIS — L89311 Pressure ulcer of right buttock, stage 1: Secondary | ICD-10-CM | POA: Diagnosis not present

## 2019-04-05 DIAGNOSIS — Z79899 Other long term (current) drug therapy: Secondary | ICD-10-CM | POA: Insufficient documentation

## 2019-04-05 DIAGNOSIS — J3489 Other specified disorders of nose and nasal sinuses: Secondary | ICD-10-CM | POA: Diagnosis not present

## 2019-04-05 DIAGNOSIS — Z96653 Presence of artificial knee joint, bilateral: Secondary | ICD-10-CM | POA: Diagnosis not present

## 2019-04-05 DIAGNOSIS — R531 Weakness: Secondary | ICD-10-CM | POA: Diagnosis not present

## 2019-04-05 DIAGNOSIS — I10 Essential (primary) hypertension: Secondary | ICD-10-CM | POA: Diagnosis not present

## 2019-04-05 DIAGNOSIS — K5900 Constipation, unspecified: Secondary | ICD-10-CM | POA: Diagnosis present

## 2019-04-05 LAB — BASIC METABOLIC PANEL
Anion gap: 8 (ref 5–15)
BUN: 20 mg/dL (ref 8–23)
CO2: 25 mmol/L (ref 22–32)
Calcium: 8.7 mg/dL — ABNORMAL LOW (ref 8.9–10.3)
Chloride: 104 mmol/L (ref 98–111)
Creatinine, Ser: 0.86 mg/dL (ref 0.61–1.24)
GFR calc Af Amer: >60 mL/min (ref >60)
GFR calc non Af Amer: >60 mL/min (ref >60)
Glucose, Bld: 106 mg/dL — ABNORMAL HIGH (ref 70–99)
Potassium: 4.2 mmol/L (ref 3.5–5.1)
Sodium: 137 mmol/L (ref 135–145)

## 2019-04-05 LAB — CBC WITH DIFFERENTIAL/PLATELET
Abs Immature Granulocytes: 0.05 10*3/uL (ref 0.00–0.07)
Basophils Absolute: 0 10*3/uL (ref 0.0–0.1)
Basophils Relative: 0 %
Eosinophils Absolute: 0.1 10*3/uL (ref 0.0–0.5)
Eosinophils Relative: 1 %
HCT: 35.8 % — ABNORMAL LOW (ref 39.0–52.0)
Hemoglobin: 11.6 g/dL — ABNORMAL LOW (ref 13.0–17.0)
Immature Granulocytes: 1 %
Lymphocytes Relative: 15 %
Lymphs Abs: 1 10*3/uL (ref 0.7–4.0)
MCH: 33.4 pg (ref 26.0–34.0)
MCHC: 32.4 g/dL (ref 30.0–36.0)
MCV: 103.2 fL — ABNORMAL HIGH (ref 80.0–100.0)
Monocytes Absolute: 0.6 10*3/uL (ref 0.1–1.0)
Monocytes Relative: 9 %
Neutro Abs: 5.1 10*3/uL (ref 1.7–7.7)
Neutrophils Relative %: 74 %
Platelets: 154 10*3/uL (ref 150–400)
RBC: 3.47 MIL/uL — ABNORMAL LOW (ref 4.22–5.81)
RDW: 12.4 % (ref 11.5–15.5)
WBC: 6.9 10*3/uL (ref 4.0–10.5)
nRBC: 0 % (ref 0.0–0.2)

## 2019-04-05 LAB — MAGNESIUM: Magnesium: 1.7 mg/dL (ref 1.7–2.4)

## 2019-04-05 MED ORDER — FLEET ENEMA 7-19 GM/118ML RE ENEM
1.0000 | ENEMA | Freq: Once | RECTAL | Status: AC
  Administered 2019-04-05: 1 via RECTAL

## 2019-04-05 NOTE — ED Provider Notes (Signed)
[facility]-EMERGENCY DEPT Provider Note   CSN: [id number] Arrival date & time: 04/05/19  0421     History Chief Complaint  Patient presents with  . Weakness    Vernon [name] Sr. is a 85 y.o. male.  85 yo M with a chief complaints of constipation.  Going on for about 3 days.  Took a couple laxatives yesterday with minimal improvement.  Try to have a bowel movement today and then was unable to get himself off the toilet.  Thinks that he strained muscle about a week ago in his arm when he tried to get up off the ground.  States using that arm is how he gets off the toilet because of the side that the railing is.  The arm is significantly swollen but has improved over the past few days.  Denies recent fall.  The history is provided by the patient.  Weakness Severity:  Moderate Onset quality:  Gradual Duration:  2 days Timing:  Constant Progression:  Worsening Chronicity:  New Relieved by:  Nothing Worsened by:  Nothing Ineffective treatments:  None tried Associated symptoms: myalgias   Associated symptoms: no abdominal pain, no arthralgias, no chest pain, no diarrhea, no fever, no headaches, no shortness of breath and no vomiting        Past Medical History:  Diagnosis Date  . Arthritis   . Hyperlipidemia   . Hypertension     Patient Active Problem List   Diagnosis Date Noted  . Syncope 09/01/2016  . Symptomatic bradycardia 09/01/2016  . Hypertension 09/01/2016  . HLD (hyperlipidemia) 09/01/2016  . Anemia 09/01/2016    Past Surgical History:  Procedure Laterality Date  . APPENDECTOMY    . HERNIA REPAIR    . JOINT REPLACEMENT Bilateral    knee        History reviewed. No pertinent family history.  Social History   Tobacco Use  . Smoking status: Never Smoker  Substance Use Topics  . Alcohol use: Not on file  . Drug use: Not on file    Home Medications Prior to Admission medications   Medication Sig Start Date End Date Taking?  Authorizing Provider  atorvastatin (LIPITOR) 40 MG tablet Take 40 mg by mouth daily.     [provider]  omeprazole (PRILOSEC) 20 MG capsule Take 20 mg by mouth daily.    [provider]  sertraline (ZOLOFT) 50 MG tablet Take 50 mg by mouth daily.    [provider]  traMADol (ULTRAM) 50 MG tablet Take 50 mg by mouth every 6 (six) hours as needed for moderate pain.  08/24/16   [provider]  valsartan (DIOVAN) 320 MG tablet Take 1 tablet (320 mg total) by mouth daily. 09/03/16 10/03/16  Glade Lloyd, MD    Allergies    Patient has no known allergies.  Review of Systems   Review of Systems  Constitutional: Negative for chills and fever.  HENT: Negative for congestion and facial swelling.   Eyes: Negative for discharge and visual disturbance.  Respiratory: Negative for shortness of breath.   Cardiovascular: Negative for chest pain and palpitations.  Gastrointestinal: Negative for abdominal pain, diarrhea and vomiting.  Musculoskeletal: Positive for myalgias. Negative for arthralgias.  Skin: Negative for color change and rash.  Neurological: Positive for weakness. Negative for tremors, syncope and headaches.  Psychiatric/Behavioral: Negative for confusion and dysphoric mood.    Physical Exam Updated Vital Signs BP 129/67   Pulse 93   Temp 98.2 F (  36.8 C) (Oral)   Ht 5\' 8"  (1.727 m)   Wt 68 kg   SpO2 96%   BMI 22.81 kg/m   Physical Exam Vitals and nursing note reviewed.  Constitutional:      Appearance: He is well-developed.  HENT:     Head: Normocephalic and atraumatic.  Eyes:     Pupils: Pupils are equal, round, and reactive to light.  Neck:     Vascular: No JVD.  Cardiovascular:     Rate and Rhythm: Normal rate and regular rhythm.     Heart sounds: No murmur. No friction rub. No gallop.   Pulmonary:     Effort: No respiratory distress.     Breath sounds: No wheezing.  Abdominal:     General: There is no distension.      Tenderness: There is no guarding or rebound.  Musculoskeletal:        General: Swelling present. Normal range of motion.     Cervical back: Normal range of motion and neck supple.     Comments: Significant edema to the right arm.  Worse to the forearm and extends up to the mid upper arm.  He has some fullness and tenderness along the distal bicep tendon.  Pulse motor and sensation are intact distally.  Skin:    Coloration: Skin is not pale.     Findings: No rash.  Neurological:     Mental Status: He is alert and oriented to person, place, and time.  Psychiatric:        Behavior: Behavior normal.     ED Results / Procedures / Treatments   Labs (all labs ordered are listed, but only abnormal results are displayed) Labs Reviewed  CBC WITH DIFFERENTIAL/PLATELET - Abnormal; Notable for the following components:      Result Value   RBC 3.47 (*)    Hemoglobin 11.6 (*)    HCT 35.8 (*)    MCV 103.2 (*)    All other components within normal limits  BASIC METABOLIC PANEL - Abnormal; Notable for the following components:   Glucose, Bld 106 (*)    Calcium 8.7 (*)    All other components within normal limits  MAGNESIUM    EKG None  Radiology No results found.  Procedures Fecal disimpaction  Date/Time: 04/05/2019 5:39 AM Performed by: Deno Etienne, DO Authorized by: Deno Etienne, DO  Consent: Verbal consent obtained. Risks and benefits: risks, benefits and alternatives were discussed Consent given by: patient Patient understanding: patient states understanding of the procedure being performed Patient consent: the patient's understanding of the procedure matches consent given Imaging studies: imaging studies not available Patient identity confirmed: verbally with patient Time out: Immediately prior to procedure a "time out" was called to verify the correct patient, procedure, equipment, support staff and site/side marked as required. Local anesthesia used: no  Anesthesia: Local  anesthesia used: no  Sedation: Patient sedated: no  Patient tolerance: patient tolerated the procedure well with no immediate complications    (including critical care time)  Medications Ordered in ED Medications  sodium phosphate (FLEET) 7-19 GM/118ML enema 1 enema (1 enema Rectal Given 04/05/19 0603)    ED Course  I have reviewed the triage vital signs and the nursing notes.  Pertinent labs & imaging results that were available during my care of the patient were reviewed by me and considered in my medical decision making (see chart for details).    MDM Rules/Calculators/A&P     CHA2DS2/VAS Stroke Risk Points  N/A >= 2 Points: High Risk  1 - 1.99 Points: Medium Risk  0 Points: Low Risk    A final score could not be computed because of missing components.: Last  Change: N/A     This score determines the patient's risk of having a stroke if the  patient has atrial fibrillation.      This score is not applicable to this patient. Components are not  calculated.                   85 yo M with a chief complaint of constipation.  Going on for about 3 days.  Patient is also been increasingly weak with a runny nose.  Tonight he was unable to get off the toilet and so he called 911 to be moved back to his bed however they talked him into coming to the ED.  The patient would like to go home but would also like to be evaluated for his constipation.  Impacted on exam, disimpacted at bedside.  Will give an enema.  Check basic lab work to assess for weakness.  Patient has a significant edema to his right arm which likely is the source of him not being able to get up from the toilet.  He feels that the swelling has improved significantly.  No bony tenderness.  Patient with significant BM.  D/c home.   6:52 AM:  I have discussed the diagnosis/risks/treatment options with the patient and believe the pt to be eligible for discharge home to follow-up with PCP. We also discussed returning to  the ED immediately if new or worsening sx occur. We discussed the sx which are most concerning (e.g., sudden worsening pain, fever, inability to tolerate by mouth) that necessitate immediate return. Medications administered to the patient during their visit and any new prescriptions provided to the patient are listed below.  Medications given during this visit Medications  sodium phosphate (FLEET) 7-19 GM/118ML enema 1 enema (1 enema Rectal Given 04/05/19 0603)     The patient appears reasonably screen and/or stabilized for discharge and I doubt any other medical condition or other [facility] requiring further screening, evaluation, or treatment in the ED at this time prior to discharge.   Final Clinical Impression(s) / ED Diagnoses Final diagnoses:  Fecal impaction [facility](HCC)    Rx / DC Orders ED Discharge Orders    None       Melene PlanFloyd, Sherril Heyward, DO 04/05/19 [phone]

## 2019-04-05 NOTE — Discharge Instructions (Signed)
Return for worsening weakness, if you are worried about getting around at home.

## 2019-04-05 NOTE — ED Notes (Signed)
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 She has dementia. He said his rectum hurts and he is constipated.

## 2019-04-05 NOTE — ED Notes (Signed)
Pt said [facility] should be in lobby to pick him up. [facility] is not in the lobby. Assisting patient to call him.

## 2019-04-05 NOTE — ED Notes (Addendum)
Around 6:00 AM, I entered the room. Pt had produced a substantial solid bowl movement prior to entering the room. With the assistance of the NT, assisted pt on bedpan. Instilled 1/4 of enema. Pt produced mostly liquid stool. Cleaned up patient, applied an adult brief, and put his clothes back on. This process took 45 minutes with the assistance of an NT. He has a stage I pressure ulcer on his right buttock.

## 2019-04-05 NOTE — ED Triage Notes (Addendum)
[facility] transferred pt from home to [facility] ED and reports the following:  Pt called EMS to assist him from couch to bedside commode because he was too weak to transfer himself. Pt agreed to be transported to hospital. He's unable to move without assistance or stand. This weakness started four days ago. Additionally, pt said he fell 3 days ago and felt a tear in his right elbow. And, he c/o constipation.   Pt called EMS 04/04/19 at 20:00 for the same assistance. EMS assisted him to bedside commode and back to couch. He declined assessment and transport. EMS didn't transport the patient and considered the call a "public assist".

## 2019-04-05 NOTE — Care Management (Signed)
ED CM at [facility] spoke with the patient via telephone. Patient has been discharged. He reports he has assistance 24 hours and does not need any additional assistance. Presenter, broadcasting

## 2019-04-14 DIAGNOSIS — Z7401 Bed confinement status: Secondary | ICD-10-CM | POA: Diagnosis not present

## 2019-04-14 DIAGNOSIS — R279 Unspecified lack of coordination: Secondary | ICD-10-CM | POA: Diagnosis not present

## 2019-04-14 DIAGNOSIS — M2550 Pain in unspecified joint: Secondary | ICD-10-CM | POA: Diagnosis not present

## 2019-04-14 DIAGNOSIS — R5381 Other malaise: Secondary | ICD-10-CM | POA: Diagnosis not present

## 2019-04-14 DIAGNOSIS — Z743 Need for continuous supervision: Secondary | ICD-10-CM | POA: Diagnosis not present

## 2019-04-15 ENCOUNTER — Emergency Department (HOSPITAL_COMMUNITY)
Admission: EM | Admit: 2019-04-15 | Discharge: 2019-04-16 | Disposition: A | Discharge status: Home / Self Care | Payer: Medicare Other | Attending: Emergency Medicine | Admitting: Emergency Medicine

## 2019-04-15 ENCOUNTER — Other Ambulatory Visit: Payer: Self-pay

## 2019-04-15 ENCOUNTER — Emergency Department (HOSPITAL_COMMUNITY): Payer: Medicare Other

## 2019-04-15 DIAGNOSIS — R262 Difficulty in walking, not elsewhere classified: Secondary | ICD-10-CM | POA: Diagnosis not present

## 2019-04-15 DIAGNOSIS — M47812 Spondylosis without myelopathy or radiculopathy, cervical region: Secondary | ICD-10-CM | POA: Diagnosis not present

## 2019-04-15 DIAGNOSIS — M4805 Spinal stenosis, thoracolumbar region: Secondary | ICD-10-CM | POA: Insufficient documentation

## 2019-04-15 DIAGNOSIS — Z96653 Presence of artificial knee joint, bilateral: Secondary | ICD-10-CM | POA: Insufficient documentation

## 2019-04-15 DIAGNOSIS — Z79899 Other long term (current) drug therapy: Secondary | ICD-10-CM | POA: Diagnosis not present

## 2019-04-15 DIAGNOSIS — I10 Essential (primary) hypertension: Secondary | ICD-10-CM | POA: Diagnosis not present

## 2019-04-15 DIAGNOSIS — R339 Retention of urine, unspecified: Secondary | ICD-10-CM | POA: Diagnosis not present

## 2019-04-15 DIAGNOSIS — R410 Disorientation, unspecified: Secondary | ICD-10-CM | POA: Diagnosis not present

## 2019-04-15 LAB — COMPREHENSIVE METABOLIC PANEL
ALT: 17 U/L (ref 0–44)
AST: 21 U/L (ref 15–41)
Albumin: 2.8 g/dL — ABNORMAL LOW (ref 3.5–5.0)
Alkaline Phosphatase: 52 U/L (ref 38–126)
Anion gap: 8 (ref 5–15)
BUN: 14 mg/dL (ref 8–23)
CO2: 28 mmol/L (ref 22–32)
Calcium: 8.7 mg/dL — ABNORMAL LOW (ref 8.9–10.3)
Chloride: 102 mmol/L (ref 98–111)
Creatinine, Ser: 0.77 mg/dL (ref 0.61–1.24)
GFR calc Af Amer: >60 mL/min (ref >60)
GFR calc non Af Amer: >60 mL/min (ref >60)
Glucose, Bld: 115 mg/dL — ABNORMAL HIGH (ref 70–99)
Potassium: 3.9 mmol/L (ref 3.5–5.1)
Sodium: 138 mmol/L (ref 135–145)
Total Bilirubin: 0.9 mg/dL (ref 0.3–1.2)
Total Protein: 5.9 g/dL — ABNORMAL LOW (ref 6.5–8.1)

## 2019-04-15 LAB — CBC WITH DIFFERENTIAL/PLATELET
Abs Immature Granulocytes: 0.07 10*3/uL (ref 0.00–0.07)
Basophils Absolute: 0 10*3/uL (ref 0.0–0.1)
Basophils Relative: 0 %
Eosinophils Absolute: 0 10*3/uL (ref 0.0–0.5)
Eosinophils Relative: 0 %
HCT: 39.3 % (ref 39.0–52.0)
Hemoglobin: 12.8 g/dL — ABNORMAL LOW (ref 13.0–17.0)
Immature Granulocytes: 1 %
Lymphocytes Relative: 9 %
Lymphs Abs: 0.9 10*3/uL (ref 0.7–4.0)
MCH: 33.3 pg (ref 26.0–34.0)
MCHC: 32.6 g/dL (ref 30.0–36.0)
MCV: 102.3 fL — ABNORMAL HIGH (ref 80.0–100.0)
Monocytes Absolute: 0.7 10*3/uL (ref 0.1–1.0)
Monocytes Relative: 7 %
Neutro Abs: 8.7 10*3/uL — ABNORMAL HIGH (ref 1.7–7.7)
Neutrophils Relative %: 83 %
Platelets: 237 10*3/uL (ref 150–400)
RBC: 3.84 MIL/uL — ABNORMAL LOW (ref 4.22–5.81)
RDW: 12 % (ref 11.5–15.5)
WBC: 10.5 10*3/uL (ref 4.0–10.5)
nRBC: 0 % (ref 0.0–0.2)

## 2019-04-15 LAB — URINALYSIS, ROUTINE W REFLEX MICROSCOPIC
Bilirubin Urine: NEGATIVE
Glucose, UA: NEGATIVE mg/dL
Hgb urine dipstick: NEGATIVE
Ketones, ur: NEGATIVE mg/dL
Leukocytes,Ua: NEGATIVE
Nitrite: NEGATIVE
Protein, ur: NEGATIVE mg/dL
Specific Gravity, Urine: 1.017 (ref 1.005–1.030)
pH: 5 (ref 5.0–8.0)

## 2019-04-15 NOTE — ED Provider Notes (Signed)
Pt signed out by Dr. Ralene Bathe pending CT scans.    Cervical:  IMPRESSION:  1. Multilevel degenerative changes of the cervical spine as  described.  2. No acute or healing fractures.   CT Thoracic:  IMPRESSION:  1. Superior endplate fracture at T5 demonstrates 40% loss of height  anteriorly. Fracture is age indeterminate and may be incompletely  healed.  2. No other acute trauma.  3. Exaggerated thoracic kyphosis.  4. Multilevel spondylosis of the thoracic spine as described.  5. Large hiatal hernia.  6. Aortic Atherosclerosis (ICD10-I70.0).       CT Lumbar:  IMPRESSION:  1. Scoliosis of the lumbar spine is convex to the right at L2 and to  the left at L4-5.  2. Ankylosis across the disc spaces at L1-2, L2-3, and L3-4.  3. Severe osteopenia.  4. Moderate central and bilateral foraminal stenosis at T12-L1.  5. Severe right and moderate left foraminal stenosis at L3-4 and  L4-5.  6. Severe central canal stenosis at L4-5.  7. Moderate central and right foraminal stenosis at L5-S1.  8. Severe left foraminal stenosis at L5-S1.   Pt d/w his friend Trudee Kuster who helps quite a bit.  He said the pt has 2 people that help him a lot.  The pt is stable for d/c with f/u with urology.   Isla Pence, MD 04/15/19 [phone]

## 2019-04-15 NOTE — ED Notes (Signed)
[facility] called to get an updated ETA for family.

## 2019-04-15 NOTE — ED Provider Notes (Signed)
[facility] Provider Note   CSN: [id number] Arrival date & time: 04/15/19  1248     History Chief Complaint  Patient presents with  . Urinary Retention    Vernon Kyle MEO Sr. is a 85 y.o. male.  The history is provided by the patient and medical records. No language interpreter was used.   Vernon Kyle MEO Sr. is a 85 y.o. male who presents to the Emergency Department complaining of urinary retention. He presents to the emergency department by EMS from home for evaluation of inability to urinate. He was recently diagnosed with UTI and has been on antibiotics for the last two days. He states he feels like he has to urinate but he can output. He has been trying to drink fluids but is still not able to put anything out. He denies any fevers, nausea, vomiting, abdominal pain. He states that he lives at home with his wife but his wife is currently in the hospital and he has a caretaker that helps him at home. He states he is not ambulatory at baseline. History is limited as patient is a very vague historian. He states that his caretaker is not available for extra information the emergency department.    Past Medical History:  Diagnosis Date  . Arthritis   . Hyperlipidemia   . Hypertension     Patient Active Problem List   Diagnosis Date Noted  . Syncope 09/01/2016  . Symptomatic bradycardia 09/01/2016  . Hypertension 09/01/2016  . HLD (hyperlipidemia) 09/01/2016  . Anemia 09/01/2016    Past Surgical History:  Procedure Laterality Date  . APPENDECTOMY    . HERNIA REPAIR    . JOINT REPLACEMENT Bilateral    knee        No family history on file.  Social History   Tobacco Use  . Smoking status: Never Smoker  Substance Use Topics  . Alcohol use: Not on file  . Drug use: Not on file    Home Medications Prior to Admission medications   Medication Sig Start Date End Date Taking? Authorizing Provider  atorvastatin (LIPITOR) 40 MG tablet  Take 40 mg by mouth daily.     [provider]  omeprazole (PRILOSEC) 20 MG capsule Take 20 mg by mouth daily.    [provider]  sertraline (ZOLOFT) 50 MG tablet Take 50 mg by mouth daily.    [provider]  traMADol (ULTRAM) 50 MG tablet Take 50 mg by mouth every 6 (six) hours as needed for moderate pain.  08/24/16   [provider]  valsartan (DIOVAN) 320 MG tablet Take 1 tablet (320 mg total) by mouth daily. 09/03/16 10/03/16  Aline August, MD    Allergies    Patient has no known allergies.  Review of Systems   Review of Systems  All other systems reviewed and are negative.   Physical Exam Updated Vital Signs BP (!) 163/78 (BP Location: Left Arm)   Pulse 74   Temp 98.1 F (36.7 C) (Oral)   Resp 16   Ht 5\' 8"  (1.727 m)   Wt 68 kg   SpO2 99%   BMI 22.81 kg/m   Physical Exam Vitals and nursing note reviewed.  Constitutional:      Appearance: He is well-developed.  HENT:     Head: Normocephalic and atraumatic.  Cardiovascular:     Rate and Rhythm: Normal rate and regular rhythm.  Pulmonary:     Effort: Pulmonary effort is normal. No respiratory  distress.  Abdominal:     Palpations: Abdomen is soft.     Tenderness: There is no abdominal tenderness. There is no guarding or rebound.  Genitourinary:    Comments: Nontender GU examination. No penile discharge. Musculoskeletal:        General: No tenderness.  Skin:    General: Skin is warm and dry.  Neurological:     Mental Status: He is alert and oriented to person, place, and time.     Comments: Hard of hearing. Generalized weakness.  Psychiatric:        Behavior: Behavior normal.     ED Results / Procedures / Treatments   Labs (all labs ordered are listed, but only abnormal results are displayed) Labs Reviewed  URINE CULTURE  COMPREHENSIVE METABOLIC PANEL  CBC WITH DIFFERENTIAL/PLATELET  URINALYSIS, ROUTINE W REFLEX MICROSCOPIC    EKG None  Radiology No results  found.  Procedures Procedures (including critical care time)  Medications Ordered in ED Medications - No data to display  ED Course  I have reviewed the triage vital signs and the nursing notes.  Pertinent labs & imaging results that were available during my care of the patient were reviewed by me and considered in my medical decision making (see chart for details).    MDM Rules/Calculators/A&P                     Additional hx availalbe after patient's initial ED arrival.  One week ago pt hurt his arm getting off the commode.  He was dx with UTI at [facility] medical associated.  He was started on abx.  Dr. Renne Crigler is PCP.  Couldn't urinate today.    Wife has a CNA 24 hours a day. Patient was able to ambulate with a walker until about one week ago. He states that he is been gradually getting weaker over the last year but has been nonambulatory for one week. Patient and friend have felt that this is due to his urinary tract infection.  He is non-toxic appearing on evaluation he did have urinary retention with 500 ML's of fluid in the bladder. Will keep a Foley catheter in place. Given additional information that is now available plan to obtain imaging of the spine to evaluate for compressive pathology given this new weakness. Patient care transferred pending a spine imaging. Final Clinical Impression(s) / ED Diagnoses Final diagnoses:  Urinary retention    Rx / DC Orders ED Discharge Orders    None       Tilden Fossa, MD 04/15/19 1556

## 2019-04-15 NOTE — ED Notes (Addendum)
Bladder scan measured "> 486 mL" in the pts bladder. RN aware.

## 2019-04-15 NOTE — ED Triage Notes (Signed)
Arrives via EMS from home. C/C not voiding. The patient was recently dx with UTI, has been on abx for x2 days, patient has been complaining that he has to urinate but cannot, and then he says he does not need to urinate, patient is baseline confused.

## 2019-04-16 DIAGNOSIS — R5381 Other malaise: Secondary | ICD-10-CM | POA: Diagnosis not present

## 2019-04-16 DIAGNOSIS — M2550 Pain in unspecified joint: Secondary | ICD-10-CM | POA: Diagnosis not present

## 2019-04-16 DIAGNOSIS — Z7401 Bed confinement status: Secondary | ICD-10-CM | POA: Diagnosis not present

## 2019-04-16 LAB — URINE CULTURE: Culture: NO GROWTH

## 2019-04-21 ENCOUNTER — Other Ambulatory Visit: Payer: Self-pay

## 2019-04-21 ENCOUNTER — Emergency Department (HOSPITAL_COMMUNITY): Payer: Medicare Other

## 2019-04-21 ENCOUNTER — Encounter (HOSPITAL_COMMUNITY): Payer: Self-pay | Admitting: *Deleted

## 2019-04-21 ENCOUNTER — Inpatient Hospital Stay (HOSPITAL_COMMUNITY)
Admission: EM | Admit: 2019-04-21 | Discharge: 2019-04-28 | DRG: 552 | Disposition: A | Discharge status: Skilled Nursing Facility | Payer: Medicare Other | Attending: Internal Medicine | Admitting: Internal Medicine

## 2019-04-21 DIAGNOSIS — L89101 Pressure ulcer of unspecified part of back, stage 1: Secondary | ICD-10-CM | POA: Diagnosis not present

## 2019-04-21 DIAGNOSIS — M5136 Other intervertebral disc degeneration, lumbar region: Secondary | ICD-10-CM | POA: Diagnosis not present

## 2019-04-21 DIAGNOSIS — M6281 Muscle weakness (generalized): Secondary | ICD-10-CM | POA: Diagnosis not present

## 2019-04-21 DIAGNOSIS — R296 Repeated falls: Secondary | ICD-10-CM | POA: Diagnosis present

## 2019-04-21 DIAGNOSIS — Z6822 Body mass index (BMI) 22.0-22.9, adult: Secondary | ICD-10-CM

## 2019-04-21 DIAGNOSIS — Z96653 Presence of artificial knee joint, bilateral: Secondary | ICD-10-CM | POA: Diagnosis not present

## 2019-04-21 DIAGNOSIS — D72829 Elevated white blood cell count, unspecified: Secondary | ICD-10-CM | POA: Diagnosis present

## 2019-04-21 DIAGNOSIS — Z7189 Other specified counseling: Secondary | ICD-10-CM

## 2019-04-21 DIAGNOSIS — R339 Retention of urine, unspecified: Secondary | ICD-10-CM | POA: Diagnosis present

## 2019-04-21 DIAGNOSIS — I959 Hypotension, unspecified: Secondary | ICD-10-CM | POA: Diagnosis present

## 2019-04-21 DIAGNOSIS — R5381 Other malaise: Secondary | ICD-10-CM | POA: Diagnosis not present

## 2019-04-21 DIAGNOSIS — M48061 Spinal stenosis, lumbar region without neurogenic claudication: Principal | ICD-10-CM | POA: Diagnosis present

## 2019-04-21 DIAGNOSIS — N179 Acute kidney failure, unspecified: Secondary | ICD-10-CM | POA: Diagnosis not present

## 2019-04-21 DIAGNOSIS — Z9181 History of falling: Secondary | ICD-10-CM

## 2019-04-21 DIAGNOSIS — Z743 Need for continuous supervision: Secondary | ICD-10-CM | POA: Diagnosis not present

## 2019-04-21 DIAGNOSIS — Z20822 Contact with and (suspected) exposure to covid-19: Secondary | ICD-10-CM | POA: Diagnosis not present

## 2019-04-21 DIAGNOSIS — R52 Pain, unspecified: Secondary | ICD-10-CM | POA: Diagnosis not present

## 2019-04-21 DIAGNOSIS — I10 Essential (primary) hypertension: Secondary | ICD-10-CM | POA: Diagnosis present

## 2019-04-21 DIAGNOSIS — A419 Sepsis, unspecified organism: Secondary | ICD-10-CM | POA: Diagnosis present

## 2019-04-21 DIAGNOSIS — Z66 Do not resuscitate: Secondary | ICD-10-CM | POA: Diagnosis present

## 2019-04-21 DIAGNOSIS — E46 Unspecified protein-calorie malnutrition: Secondary | ICD-10-CM | POA: Diagnosis present

## 2019-04-21 DIAGNOSIS — E860 Dehydration: Secondary | ICD-10-CM | POA: Diagnosis present

## 2019-04-21 DIAGNOSIS — M419 Scoliosis, unspecified: Secondary | ICD-10-CM | POA: Diagnosis not present

## 2019-04-21 DIAGNOSIS — I493 Ventricular premature depolarization: Secondary | ICD-10-CM | POA: Diagnosis not present

## 2019-04-21 DIAGNOSIS — E872 Acidosis: Secondary | ICD-10-CM | POA: Diagnosis not present

## 2019-04-21 DIAGNOSIS — I5032 Chronic diastolic (congestive) heart failure: Secondary | ICD-10-CM | POA: Diagnosis present

## 2019-04-21 DIAGNOSIS — R64 Cachexia: Secondary | ICD-10-CM | POA: Diagnosis present

## 2019-04-21 DIAGNOSIS — D649 Anemia, unspecified: Secondary | ICD-10-CM | POA: Diagnosis present

## 2019-04-21 DIAGNOSIS — R29898 Other symptoms and signs involving the musculoskeletal system: Secondary | ICD-10-CM

## 2019-04-21 DIAGNOSIS — L89152 Pressure ulcer of sacral region, stage 2: Secondary | ICD-10-CM | POA: Diagnosis not present

## 2019-04-21 DIAGNOSIS — Z515 Encounter for palliative care: Secondary | ICD-10-CM

## 2019-04-21 DIAGNOSIS — Z960 Presence of urogenital implants: Secondary | ICD-10-CM | POA: Diagnosis present

## 2019-04-21 DIAGNOSIS — Z79899 Other long term (current) drug therapy: Secondary | ICD-10-CM

## 2019-04-21 DIAGNOSIS — I491 Atrial premature depolarization: Secondary | ICD-10-CM | POA: Diagnosis not present

## 2019-04-21 DIAGNOSIS — D638 Anemia in other chronic diseases classified elsewhere: Secondary | ICD-10-CM | POA: Diagnosis present

## 2019-04-21 DIAGNOSIS — Z9049 Acquired absence of other specified parts of digestive tract: Secondary | ICD-10-CM

## 2019-04-21 DIAGNOSIS — I110 Hypertensive heart disease with heart failure: Secondary | ICD-10-CM | POA: Diagnosis not present

## 2019-04-21 DIAGNOSIS — M4800 Spinal stenosis, site unspecified: Secondary | ICD-10-CM | POA: Diagnosis present

## 2019-04-21 DIAGNOSIS — R531 Weakness: Secondary | ICD-10-CM | POA: Diagnosis not present

## 2019-04-21 DIAGNOSIS — H9190 Unspecified hearing loss, unspecified ear: Secondary | ICD-10-CM | POA: Diagnosis not present

## 2019-04-21 DIAGNOSIS — L8990 Pressure ulcer of unspecified site, unspecified stage: Secondary | ICD-10-CM | POA: Diagnosis present

## 2019-04-21 DIAGNOSIS — R279 Unspecified lack of coordination: Secondary | ICD-10-CM | POA: Diagnosis not present

## 2019-04-21 DIAGNOSIS — E8809 Other disorders of plasma-protein metabolism, not elsewhere classified: Secondary | ICD-10-CM | POA: Diagnosis present

## 2019-04-21 DIAGNOSIS — Z20828 Contact with and (suspected) exposure to other viral communicable diseases: Secondary | ICD-10-CM | POA: Diagnosis not present

## 2019-04-21 DIAGNOSIS — R0902 Hypoxemia: Secondary | ICD-10-CM | POA: Diagnosis not present

## 2019-04-21 DIAGNOSIS — Z87311 Personal history of (healed) other pathological fracture: Secondary | ICD-10-CM

## 2019-04-21 DIAGNOSIS — M79603 Pain in arm, unspecified: Secondary | ICD-10-CM | POA: Diagnosis not present

## 2019-04-21 LAB — URINALYSIS, ROUTINE W REFLEX MICROSCOPIC
Bilirubin Urine: NEGATIVE
Glucose, UA: NEGATIVE mg/dL
Ketones, ur: NEGATIVE mg/dL
Leukocytes,Ua: NEGATIVE
Nitrite: NEGATIVE
Protein, ur: 30 mg/dL — AB
Specific Gravity, Urine: 1.017 (ref 1.005–1.030)
pH: 5 (ref 5.0–8.0)

## 2019-04-21 LAB — COMPREHENSIVE METABOLIC PANEL
ALT: 19 U/L (ref 0–44)
AST: 29 U/L (ref 15–41)
Albumin: 2.6 g/dL — ABNORMAL LOW (ref 3.5–5.0)
Alkaline Phosphatase: 53 U/L (ref 38–126)
Anion gap: 14 (ref 5–15)
BUN: 53 mg/dL — ABNORMAL HIGH (ref 8–23)
CO2: 24 mmol/L (ref 22–32)
Calcium: 8.4 mg/dL — ABNORMAL LOW (ref 8.9–10.3)
Chloride: 100 mmol/L (ref 98–111)
Creatinine, Ser: 1.51 mg/dL — ABNORMAL HIGH (ref 0.61–1.24)
GFR calc Af Amer: 44 mL/min — ABNORMAL LOW (ref >60)
GFR calc non Af Amer: 38 mL/min — ABNORMAL LOW (ref >60)
Glucose, Bld: 105 mg/dL — ABNORMAL HIGH (ref 70–99)
Potassium: 4.7 mmol/L (ref 3.5–5.1)
Sodium: 138 mmol/L (ref 135–145)
Total Bilirubin: 1.1 mg/dL (ref 0.3–1.2)
Total Protein: 6.4 g/dL — ABNORMAL LOW (ref 6.5–8.1)

## 2019-04-21 LAB — CBC
HCT: 40.6 % (ref 39.0–52.0)
Hemoglobin: 13 g/dL (ref 13.0–17.0)
MCH: 33 pg (ref 26.0–34.0)
MCHC: 32 g/dL (ref 30.0–36.0)
MCV: 103 fL — ABNORMAL HIGH (ref 80.0–100.0)
Platelets: 226 10*3/uL (ref 150–400)
RBC: 3.94 MIL/uL — ABNORMAL LOW (ref 4.22–5.81)
RDW: 12.2 % (ref 11.5–15.5)
WBC: 17.6 10*3/uL — ABNORMAL HIGH (ref 4.0–10.5)
nRBC: 0 % (ref 0.0–0.2)

## 2019-04-21 MED ORDER — SODIUM CHLORIDE 0.9 % IV BOLUS
500.0000 mL | Freq: Once | INTRAVENOUS | Status: AC
  Administered 2019-04-22: 500 mL via INTRAVENOUS

## 2019-04-21 MED ORDER — SODIUM CHLORIDE 0.9 % IV BOLUS
500.0000 mL | Freq: Once | INTRAVENOUS | Status: AC
  Administered 2019-04-21: 500 mL via INTRAVENOUS

## 2019-04-21 NOTE — ED Triage Notes (Signed)
Per EMS, [facility] was transporting pt home from orthopedic appointment and report he had systolic BP in the 62B, palpated. Pt then had 90 systolic blood pressure when repeated. [facility] was called to transport pt to hospital. Pt is A&O at baseline.  CBG 110 BP 115/54 SpO2 100% on RA

## 2019-04-21 NOTE — ED Provider Notes (Signed)
Transferred from was a [facility] for MRI of the spine. He presents to the ED for evaluation of hypertension. He states that over the last week he is not had anything to eat due to poor appetite. He states that his wife is currently suffering from severe dementia and there is stress at home. He denies any fevers, nausea, vomiting, diarrhea. Per report he is sent to the emergency department for hypotension.   On examination he is non-toxic appearing. He has five out of five bilateral upper extremity strength with 3/5 bilateral lower extremity strength. Plan to obtain MRI of the spine. Will add on lactate and blood cultures due to CBC with leukocytosis. There is no current evidence of infectious process at this time.   Vernon Reichert, MD 04/21/19 [phone]

## 2019-04-21 NOTE — ED Notes (Signed)
ED TO INPATIENT HANDOFF REPORT  Name/Age/Gender Leanord Hawking MEO Sr. 85 y.o. male  Code Status Code Status History    Date Active Date Inactive Code Status Order ID Comments User Context   09/01/2016 1228 09/03/2016 1758 Full Code [id number]  Russella Dar, NP Inpatient   Advance Care Planning Activity      Home/SNF/Other Home  Chief Complaint hypotension  Level of Care/Admitting Diagnosis ED Disposition    ED Disposition Condition Comment   Transfer to Another Facility  The patient appears reasonably stabilized for transfer considering the current resources, flow, and capabilities available in the ED at this time, and I doubt any other [facility] requiring further screening and/or treatment in the ED prior to transfer is p resent.       Medical History Past Medical History:  Diagnosis Date  . Arthritis   . Hyperlipidemia   . Hypertension     Allergies No Known Allergies  IV Location/Drains/Wounds Patient Lines/Drains/Airways Status   Active Line/Drains/Airways    Name:   Placement date:   Placement time:   Site:   Days:   Peripheral IV 04/15/19 Left Antecubital   04/15/19    1355    Antecubital   6   Peripheral IV 04/21/19 Left Antecubital   04/21/19    1710    Antecubital   less than 1   Urethral Catheter Jessica, NT Straight-tip 16 Fr.   04/15/19    1403    Straight-tip   6          Labs/Imaging Results for orders placed or performed during the hospital encounter of 04/21/19 (from the past 48 hour(s))  CBC     Status: Abnormal   Collection Time: 04/21/19  5:22 PM  Result Value Ref Range   WBC 17.6 (H) 4.0 - 10.5 K/uL   RBC 3.94 (L) 4.22 - 5.81 MIL/uL   Hemoglobin 13.0 13.0 - 17.0 g/dL   HCT 14.7 82.9 - 56.2 %   MCV 103.0 (H) 80.0 - 100.0 fL   MCH 33.0 26.0 - 34.0 pg   MCHC 32.0 30.0 - 36.0 g/dL   RDW 13.0 86.5 - 78.4 %   Platelets 226 150 - 400 K/uL   nRBC 0.0 0.0 - 0.2 %    Comment: Performed at [facility], [address],  [place], [place] [address]  CMP     Status: Abnormal   Collection Time: 04/21/19  5:22 PM  Result Value Ref Range   Sodium 138 135 - 145 mmol/L   Potassium 4.7 3.5 - 5.1 mmol/L   Chloride 100 98 - 111 mmol/L   CO2 24 22 - 32 mmol/L   Glucose, Bld 105 (H) 70 - 99 mg/dL   BUN 53 (H) 8 - 23 mg/dL   Creatinine, Ser 5.28 (H) 0.61 - 1.24 mg/dL   Calcium 8.4 (L) 8.9 - 10.3 mg/dL   Total Protein 6.4 (L) 6.5 - 8.1 g/dL   Albumin 2.6 (L) 3.5 - 5.0 g/dL   AST 29 15 - 41 U/L   ALT 19 0 - 44 U/L   Alkaline Phosphatase 53 38 - 126 U/L   Total Bilirubin 1.1 0.3 - 1.2 mg/dL   GFR calc non Af Amer 38 (L) >60 mL/min   GFR calc Af Amer 44 (L) >60 mL/min   Anion gap 14 5 - 15    Comment: Performed at [facility], [address] [place], [place] [address]  Urinalysis  Status: Abnormal   Collection Time: 04/21/19  5:22 PM  Result Value Ref Range   Color, Urine YELLOW YELLOW   APPearance HAZY (A) CLEAR   Specific Gravity, Urine 1.017 1.005 - 1.030   pH 5.0 5.0 - 8.0   Glucose, UA NEGATIVE NEGATIVE mg/dL   Hgb urine dipstick SMALL (A) NEGATIVE   Bilirubin Urine NEGATIVE NEGATIVE   Ketones, ur NEGATIVE NEGATIVE mg/dL   Protein, ur 30 (A) NEGATIVE mg/dL   Nitrite NEGATIVE NEGATIVE   Leukocytes,Ua NEGATIVE NEGATIVE   RBC / HPF 6-10 0 - 5 RBC/hpf   WBC, UA 0-5 0 - 5 WBC/hpf   Bacteria, UA RARE (A) NONE SEEN   Squamous Epithelial / LPF 0-5 0 - 5   Mucus PRESENT    Hyaline Casts, UA PRESENT     Comment: Performed at [facility], [place] [address], [place], [place] [address]   XR Chest Single View  Result Date: 04/21/2019 CLINICAL DATA:  Hypotension EXAM: PORTABLE CHEST 1 VIEW COMPARISON:  None. FINDINGS: Enlarged cardiac silhouette that could be due to cardiomegaly and/or pericardial fluid. Retrocardiac density presumed associated with a hiatal hernia. Aortic atherosclerosis. The lungs are clear. No evidence of heart failure or effusion. IMPRESSION: Enlarged  cardiac silhouette that could be cardiomegaly and/or pericardial fluid. Probable hiatal hernia. Aortic atherosclerosis. Lungs clear.  No edema. Electronically Signed   By: Nelson Chimes M.D.   On: 04/21/2019 17:51    Pending Labs Unresulted Labs (From admission, onward)    Start     Ordered   04/21/19 1856  SARS CORONAVIRUS 2 (TAT 6-24 HRS) Nasopharyngeal Nasopharyngeal Swab  (Tier 3 (TAT 6-24 hrs))  Once,   STAT    Question Answer Comment  Is this test for diagnosis or screening Screening   Symptomatic for COVID-19 as defined by CDC No   Hospitalized for COVID-19 No   Admitted to ICU for COVID-19 No   Previously tested for COVID-19 No   Resident in a congregate (group) care setting Unknown   Employed in healthcare setting Unknown      04/21/19 1855   04/21/19 1647  Blood Culture x 1  ONCE - STAT,   STAT     04/21/19 1647          Vitals/Pain Today's Vitals   04/21/19 1930 04/21/19 2000 04/21/19 2030 04/21/19 2130  BP: (!) 109/51 (!) 95/54 91/60 (!) 93/51  Pulse: (!) 50 84 88 95  Resp: (!) 24 10 (!) 25 15  SpO2: 99% 97% 97% 99%  PainSc:        Isolation Precautions No active isolations  Medications Medications  sodium chloride 0.9 % bolus 500 mL (0 mLs Intravenous Stopped 04/21/19 2017)    Mobility non-ambulatory

## 2019-04-21 NOTE — ED Provider Notes (Signed)
[facility] Emergency Department Provider Note MRN:  [id number]  Arrival date & time: 04/21/19     Chief Complaint   Hypotension   History of Present Illness   Vernon REISTER MEO Sr. is a 85 y.o. year-old male with a history of hypertension, hyperlipidemia presenting to the ED with chief complaint of hypotension.  Patient was following up at his orthopedics office when they found his blood pressure to be in the 32I systolic.  Patient feels well otherwise.  Has been experiencing generalized weakness, worse in the legs for the past 2 weeks.  Had a recent ED visit for urinary retention.  Otherwise denies chest pain, no shortness of breath, no abdominal pain, no fever, no cough, no headache, no vomiting, no diarrhea.  Endorsing poor appetite for the past 4 days.  Over the phone, son explains that his weakness has been gradual over the past 3 years but much worse over the past 2 weeks.  Some episodes of bowel incontinence over the past few days.  Review of Systems  A complete 10 system review of systems was obtained and all systems are negative except as noted in the HPI and PMH.   Patient's Health History    Past Medical History:  Diagnosis Date  . Arthritis   . Hyperlipidemia   . Hypertension     Past Surgical History:  Procedure Laterality Date  . APPENDECTOMY    . HERNIA REPAIR    . JOINT REPLACEMENT Bilateral    knee     No family history on file.  Social History   Socioeconomic History  . Marital status: Married    Spouse name: Not on file  . Number of children: Not on file  . Years of education: Not on file  . Highest education level: Not on file  Occupational History  . Not on file  Tobacco Use  . Smoking status: Never Smoker  Substance and Sexual Activity  . Alcohol use: Not on file  . Drug use: Not on file  . Sexual activity: Not on file  Other Topics Concern  . Not on file  Social History Narrative  . Not on file   Social Determinants  of Health   Financial Resource Strain:   . Difficulty of Paying Living Expenses: Not on file  Food Insecurity:   . Worried About Charity fundraiser in the Last Year: Not on file  . Ran Out of Food in the Last Year: Not on file  Transportation Needs:   . Lack of Transportation (Medical): Not on file  . Lack of Transportation (Non-Medical): Not on file  Physical Activity:   . Days of Exercise per Week: Not on file  . Minutes of Exercise per Session: Not on file  Stress:   . Feeling of Stress : Not on file  Social Connections:   . Frequency of Communication with Friends and Family: Not on file  . Frequency of Social Gatherings with Friends and Family: Not on file  . Attends Religious Services: Not on file  . Active Member of Clubs or Organizations: Not on file  . Attends Archivist Meetings: Not on file  . Marital Status: Not on file  Intimate Partner Violence:   . Fear of Current or Ex-Partner: Not on file  . Emotionally Abused: Not on file  . Physically Abused: Not on file  . Sexually Abused: Not on file     Physical Exam  Vital Signs and Nursing Notes reviewed  Vitals:   04/21/19 1846 04/21/19 1900  BP: 121/66 (!) 112/54  Pulse: (!) 101 83  Resp: 20 (!) 23  SpO2: 100% 98%    CONSTITUTIONAL: Chronically ill-appearing, NAD NEURO:  Alert and oriented x 3, decreased strength to bilateral lower extremities, normal strength to the bilateral upper extremities, normal sensation throughout, normal speech EYES:  eyes equal and reactive ENT/NECK:  no LAD, no JVD CARDIO: Regular rate, well-perfused, normal S1 and S2 PULM:  CTAB no wheezing or rhonchi GI/GU:  normal bowel sounds, non-distended, non-tender MSK/SPINE:  No gross deformities, no edema SKIN:  no rash, atraumatic PSYCH:  Appropriate speech and behavior  Diagnostic and Interventional Summary    EKG Interpretation  Date/Time:  Tuesday April 21 2019 17:04:04 EST Ventricular Rate:  96 PR Interval:    QRS  Duration: 96 QT Interval:  362 QTC Calculation: 458 R Axis:   11 Text Interpretation: Sinus tachycardia Ventricular premature complex No significant change was found Confirmed by Kennis Carina [phone]) on 04/21/2019 5:52:14 PM      Labs Reviewed  CBC - Abnormal; Notable for the following components:      Result Value   WBC 17.6 (*)    RBC 3.94 (*)    MCV 103.0 (*)    All other components within normal limits  COMPREHENSIVE METABOLIC PANEL - Abnormal; Notable for the following components:   Glucose, Bld 105 (*)    BUN 53 (*)    Creatinine, Ser 1.51 (*)    Calcium 8.4 (*)    Total Protein 6.4 (*)    Albumin 2.6 (*)    GFR calc non Af Amer 38 (*)    GFR calc Af Amer 44 (*)    All other components within normal limits  URINALYSIS, ROUTINE W REFLEX MICROSCOPIC - Abnormal; Notable for the following components:   APPearance HAZY (*)    Hgb urine dipstick SMALL (*)    Protein, ur 30 (*)    Bacteria, UA RARE (*)    All other components within normal limits  CULTURE, BLOOD (SINGLE)  SARS CORONAVIRUS 2 (TAT 6-24 HRS)    XR Chest Single View  Final Result    MR LUMBAR SPINE WO CONTRAST    (Results Pending)  MR THORACIC SPINE WO CONTRAST    (Results Pending)    Medications  sodium chloride 0.9 % bolus 500 mL (500 mLs Intravenous New Bag/Given 04/21/19 1715)     Procedures  /  Critical Care Procedures  ED Course and Medical Decision Making  I have reviewed the triage vital signs and the nursing notes.  Pertinent labs & imaging results that were available during my care of the patient were reviewed by me and considered in my medical decision making (see below for details).     Reportedly hypotensive in the orthopedic office, improved upon arrival, asymptomatic.  Considering dehydration related to the poor p.o. intake, considering early sepsis, awaiting EKG, chest x-ray, urinalysis, labs.  Markedly decreased drinks to the lower extremities, with urinary retention recently as  well as report of bowel incontinence, will need MRI to exclude cauda equina or myelopathy.  Work-up reveals leukocytosis, mild AKI.  Attempted admission to hospitalist service but hospital service would recommend MRI results to make sure there is no emergent myelopathy.  Unfortunately there will be significant delay in obtaining MRI here at [facility].  Will be able to get MRI until tomorrow morning.  Given the concern for myelopathy, will transfer ER to ER to [facility], Dr. Madilyn Hookees excepting.  Elmer SowMichael M. Pilar PlateBero, MD [facility] [email or web address]  Final Clinical Impressions(s) / ED Diagnoses     ICD-10-CM   1. Weakness of both lower extremities  R29.898   2. Hypotension  I95.9 XR Chest Single View    XR Chest Single View  3. AKI (acute kidney injury) (HCC)  N17.9     ED Discharge Orders    None       Discharge Instructions Discussed with and Provided to Patient:   Discharge Instructions   None       Sabas SousBero, Andoni Busch M, MD 04/21/19 1946

## 2019-04-21 NOTE — ED Notes (Signed)
[facility] called for transport 

## 2019-04-22 ENCOUNTER — Emergency Department (HOSPITAL_COMMUNITY): Payer: Medicare Other

## 2019-04-22 DIAGNOSIS — L89610 Pressure ulcer of right heel, unstageable: Secondary | ICD-10-CM | POA: Diagnosis not present

## 2019-04-22 DIAGNOSIS — Z515 Encounter for palliative care: Secondary | ICD-10-CM | POA: Diagnosis not present

## 2019-04-22 DIAGNOSIS — M5136 Other intervertebral disc degeneration, lumbar region: Secondary | ICD-10-CM | POA: Diagnosis present

## 2019-04-22 DIAGNOSIS — M2550 Pain in unspecified joint: Secondary | ICD-10-CM | POA: Diagnosis not present

## 2019-04-22 DIAGNOSIS — M48061 Spinal stenosis, lumbar region without neurogenic claudication: Secondary | ICD-10-CM | POA: Diagnosis not present

## 2019-04-22 DIAGNOSIS — Z6822 Body mass index (BMI) 22.0-22.9, adult: Secondary | ICD-10-CM | POA: Diagnosis not present

## 2019-04-22 DIAGNOSIS — I959 Hypotension, unspecified: Secondary | ICD-10-CM | POA: Diagnosis not present

## 2019-04-22 DIAGNOSIS — L89620 Pressure ulcer of left heel, unstageable: Secondary | ICD-10-CM | POA: Diagnosis not present

## 2019-04-22 DIAGNOSIS — E8809 Other disorders of plasma-protein metabolism, not elsewhere classified: Secondary | ICD-10-CM | POA: Diagnosis not present

## 2019-04-22 DIAGNOSIS — R29898 Other symptoms and signs involving the musculoskeletal system: Secondary | ICD-10-CM | POA: Diagnosis present

## 2019-04-22 DIAGNOSIS — I110 Hypertensive heart disease with heart failure: Secondary | ICD-10-CM | POA: Diagnosis present

## 2019-04-22 DIAGNOSIS — N179 Acute kidney failure, unspecified: Secondary | ICD-10-CM | POA: Diagnosis not present

## 2019-04-22 DIAGNOSIS — M4800 Spinal stenosis, site unspecified: Secondary | ICD-10-CM | POA: Diagnosis present

## 2019-04-22 DIAGNOSIS — L8990 Pressure ulcer of unspecified site, unspecified stage: Secondary | ICD-10-CM | POA: Diagnosis present

## 2019-04-22 DIAGNOSIS — E46 Unspecified protein-calorie malnutrition: Secondary | ICD-10-CM | POA: Diagnosis not present

## 2019-04-22 DIAGNOSIS — Z960 Presence of urogenital implants: Secondary | ICD-10-CM | POA: Diagnosis present

## 2019-04-22 DIAGNOSIS — L89150 Pressure ulcer of sacral region, unstageable: Secondary | ICD-10-CM | POA: Diagnosis not present

## 2019-04-22 DIAGNOSIS — R64 Cachexia: Secondary | ICD-10-CM | POA: Diagnosis present

## 2019-04-22 DIAGNOSIS — L89101 Pressure ulcer of unspecified part of back, stage 1: Secondary | ICD-10-CM | POA: Diagnosis present

## 2019-04-22 DIAGNOSIS — I10 Essential (primary) hypertension: Secondary | ICD-10-CM | POA: Diagnosis not present

## 2019-04-22 DIAGNOSIS — I5032 Chronic diastolic (congestive) heart failure: Secondary | ICD-10-CM | POA: Diagnosis present

## 2019-04-22 DIAGNOSIS — Z96653 Presence of artificial knee joint, bilateral: Secondary | ICD-10-CM | POA: Diagnosis present

## 2019-04-22 DIAGNOSIS — Z66 Do not resuscitate: Secondary | ICD-10-CM | POA: Diagnosis present

## 2019-04-22 DIAGNOSIS — Z7189 Other specified counseling: Secondary | ICD-10-CM | POA: Diagnosis not present

## 2019-04-22 DIAGNOSIS — L89152 Pressure ulcer of sacral region, stage 2: Secondary | ICD-10-CM | POA: Diagnosis not present

## 2019-04-22 DIAGNOSIS — E872 Acidosis: Secondary | ICD-10-CM | POA: Diagnosis present

## 2019-04-22 DIAGNOSIS — D638 Anemia in other chronic diseases classified elsewhere: Secondary | ICD-10-CM | POA: Diagnosis present

## 2019-04-22 DIAGNOSIS — H9190 Unspecified hearing loss, unspecified ear: Secondary | ICD-10-CM | POA: Diagnosis present

## 2019-04-22 DIAGNOSIS — I493 Ventricular premature depolarization: Secondary | ICD-10-CM | POA: Diagnosis present

## 2019-04-22 DIAGNOSIS — R339 Retention of urine, unspecified: Secondary | ICD-10-CM | POA: Diagnosis present

## 2019-04-22 DIAGNOSIS — R6520 Severe sepsis without septic shock: Secondary | ICD-10-CM

## 2019-04-22 DIAGNOSIS — R531 Weakness: Secondary | ICD-10-CM | POA: Diagnosis not present

## 2019-04-22 DIAGNOSIS — E860 Dehydration: Secondary | ICD-10-CM | POA: Diagnosis present

## 2019-04-22 DIAGNOSIS — M6281 Muscle weakness (generalized): Secondary | ICD-10-CM | POA: Diagnosis not present

## 2019-04-22 DIAGNOSIS — A419 Sepsis, unspecified organism: Secondary | ICD-10-CM | POA: Diagnosis not present

## 2019-04-22 DIAGNOSIS — W19XXXA Unspecified fall, initial encounter: Secondary | ICD-10-CM | POA: Diagnosis not present

## 2019-04-22 DIAGNOSIS — I340 Nonrheumatic mitral (valve) insufficiency: Secondary | ICD-10-CM | POA: Diagnosis not present

## 2019-04-22 DIAGNOSIS — Z20822 Contact with and (suspected) exposure to covid-19: Secondary | ICD-10-CM | POA: Diagnosis present

## 2019-04-22 DIAGNOSIS — R296 Repeated falls: Secondary | ICD-10-CM | POA: Diagnosis present

## 2019-04-22 DIAGNOSIS — I350 Nonrheumatic aortic (valve) stenosis: Secondary | ICD-10-CM | POA: Diagnosis not present

## 2019-04-22 DIAGNOSIS — L89140 Pressure ulcer of left lower back, unstageable: Secondary | ICD-10-CM | POA: Diagnosis not present

## 2019-04-22 DIAGNOSIS — D72829 Elevated white blood cell count, unspecified: Secondary | ICD-10-CM | POA: Diagnosis present

## 2019-04-22 DIAGNOSIS — L89159 Pressure ulcer of sacral region, unspecified stage: Secondary | ICD-10-CM

## 2019-04-22 DIAGNOSIS — M419 Scoliosis, unspecified: Secondary | ICD-10-CM | POA: Diagnosis present

## 2019-04-22 DIAGNOSIS — Z7401 Bed confinement status: Secondary | ICD-10-CM | POA: Diagnosis not present

## 2019-04-22 DIAGNOSIS — R338 Other retention of urine: Secondary | ICD-10-CM | POA: Diagnosis not present

## 2019-04-22 LAB — CBC
HCT: 40.3 % (ref 39.0–52.0)
Hemoglobin: 13.1 g/dL (ref 13.0–17.0)
MCH: 33.2 pg (ref 26.0–34.0)
MCHC: 32.5 g/dL (ref 30.0–36.0)
MCV: 102 fL — ABNORMAL HIGH (ref 80.0–100.0)
Platelets: 231 10*3/uL (ref 150–400)
RBC: 3.95 MIL/uL — ABNORMAL LOW (ref 4.22–5.81)
RDW: 12.3 % (ref 11.5–15.5)
WBC: 16.1 10*3/uL — ABNORMAL HIGH (ref 4.0–10.5)
nRBC: 0 % (ref 0.0–0.2)

## 2019-04-22 LAB — BASIC METABOLIC PANEL
Anion gap: 14 (ref 5–15)
BUN: 49 mg/dL — ABNORMAL HIGH (ref 8–23)
CO2: 19 mmol/L — ABNORMAL LOW (ref 22–32)
Calcium: 8.5 mg/dL — ABNORMAL LOW (ref 8.9–10.3)
Chloride: 103 mmol/L (ref 98–111)
Creatinine, Ser: 1.13 mg/dL (ref 0.61–1.24)
GFR calc Af Amer: >60 mL/min (ref >60)
GFR calc non Af Amer: 54 mL/min — ABNORMAL LOW (ref >60)
Glucose, Bld: 115 mg/dL — ABNORMAL HIGH (ref 70–99)
Potassium: 3.9 mmol/L (ref 3.5–5.1)
Sodium: 136 mmol/L (ref 135–145)

## 2019-04-22 LAB — C-REACTIVE PROTEIN: CRP: 14.6 mg/dL — ABNORMAL HIGH (ref <1.0)

## 2019-04-22 LAB — CK: Total CK: 295 U/L (ref 49–397)

## 2019-04-22 LAB — SEDIMENTATION RATE: Sed Rate: 68 mm/hr — ABNORMAL HIGH (ref 0–16)

## 2019-04-22 LAB — TROPONIN I (HIGH SENSITIVITY)
Troponin I (High Sensitivity): 21 ng/L — ABNORMAL HIGH (ref <18)
Troponin I (High Sensitivity): 20 ng/L — ABNORMAL HIGH (ref <18)

## 2019-04-22 LAB — LACTIC ACID, PLASMA
Lactic Acid, Venous: 2.7 mmol/L (ref 0.5–1.9)
Lactic Acid, Venous: 2.7 mmol/L (ref 0.5–1.9)
Lactic Acid, Venous: 1.6 mmol/L (ref 0.5–1.9)

## 2019-04-22 LAB — PREALBUMIN: Prealbumin: 5.7 mg/dL — ABNORMAL LOW (ref 18–38)

## 2019-04-22 LAB — SARS CORONAVIRUS 2 (TAT 6-24 HRS): SARS Coronavirus 2: NEGATIVE

## 2019-04-22 MED ORDER — ACETAMINOPHEN 650 MG RE SUPP: 650.0000 mg | Freq: Four times a day (QID) | RECTAL | Status: DC | PRN

## 2019-04-22 MED ORDER — SODIUM CHLORIDE 0.9% FLUSH
3.0000 mL | Freq: Two times a day (BID) | INTRAVENOUS | Status: DC
  Administered 2019-04-22 – 2019-04-28 (×12): 3 mL via INTRAVENOUS

## 2019-04-22 MED ORDER — VANCOMYCIN HCL 750 MG/150ML IV SOLN
750.0000 mg | INTRAVENOUS | Status: DC
  Administered 2019-04-23: 750 mg via INTRAVENOUS
  Filled 2019-04-22 (×2): qty 150

## 2019-04-22 MED ORDER — ONDANSETRON HCL 4 MG PO TABS: 4.0000 mg | ORAL_TABLET | Freq: Four times a day (QID) | ORAL | Status: DC | PRN

## 2019-04-22 MED ORDER — SODIUM CHLORIDE 0.9 % IV BOLUS
500.0000 mL | Freq: Once | INTRAVENOUS | Status: AC
  Administered 2019-04-22: 500 mL via INTRAVENOUS

## 2019-04-22 MED ORDER — ACETAMINOPHEN 325 MG PO TABS: 650.0000 mg | ORAL_TABLET | Freq: Four times a day (QID) | ORAL | Status: DC | PRN

## 2019-04-22 MED ORDER — SODIUM CHLORIDE 0.9 % IV SOLN: 2.0000 g | INTRAVENOUS | Status: DC

## 2019-04-22 MED ORDER — ENOXAPARIN SODIUM 30 MG/0.3ML ~~LOC~~ SOLN
30.0000 mg | SUBCUTANEOUS | Status: DC
  Administered 2019-04-22 – 2019-04-23 (×2): 30 mg via SUBCUTANEOUS
  Filled 2019-04-22 (×2): qty 0.3

## 2019-04-22 MED ORDER — SODIUM CHLORIDE 0.9 % IV SOLN
2.0000 g | Freq: Two times a day (BID) | INTRAVENOUS | Status: DC
  Administered 2019-04-22 – 2019-04-25 (×6): 2 g via INTRAVENOUS
  Filled 2019-04-22 (×8): qty 2

## 2019-04-22 MED ORDER — SODIUM CHLORIDE 0.9 % IV SOLN
2.0000 g | Freq: Once | INTRAVENOUS | Status: AC
  Administered 2019-04-22: 2 g via INTRAVENOUS
  Filled 2019-04-22: qty 2

## 2019-04-22 MED ORDER — ONDANSETRON HCL 4 MG/2ML IJ SOLN: 4.0000 mg | Freq: Four times a day (QID) | INTRAMUSCULAR | Status: DC | PRN

## 2019-04-22 MED ORDER — VANCOMYCIN HCL 1250 MG/250ML IV SOLN
1250.0000 mg | Freq: Once | INTRAVENOUS | Status: AC
  Administered 2019-04-22: 1250 mg via INTRAVENOUS
  Filled 2019-04-22: qty 250

## 2019-04-22 MED ORDER — VANCOMYCIN VARIABLE DOSE PER UNSTABLE RENAL FUNCTION (PHARMACIST DOSING): Status: DC

## 2019-04-22 MED ORDER — SODIUM CHLORIDE 0.9 % IV SOLN: 1.0000 g | INTRAVENOUS | Status: DC

## 2019-04-22 MED ORDER — SODIUM CHLORIDE 0.9 % IV SOLN: Freq: Once | INTRAVENOUS | Status: AC

## 2019-04-22 MED ORDER — ALBUTEROL SULFATE (2.5 MG/3ML) 0.083% IN NEBU: 2.5000 mg | INHALATION_SOLUTION | Freq: Four times a day (QID) | RESPIRATORY_TRACT | Status: DC | PRN

## 2019-04-22 NOTE — ED Notes (Signed)
Tele   Breakfast ordered  

## 2019-04-22 NOTE — ED Notes (Signed)
IV dislodged. IV team consult to be placed.

## 2019-04-22 NOTE — Progress Notes (Signed)
Pharmacy Antibiotic Note  Vernon Walton Sr. is a 85 y.o. male admitted on 04/21/2019 with infection on unknown source.  Pharmacy has been consulted for vancomycin and cefepime dosing. Patient was given 1x doses in the ER earlier this AM. AKI - SCr 1.51 on 12/29, repeat SCr pending for today.  Plan: F/u renal function to enter further vancomycin maintenance doses Increase cefepime to 2g IV q24h Monitor clinical progress, c/s, renal function F/u de-escalation plan/LOT, vancomycin levels as indicated      Temp (24hrs), Avg:98.3 F (36.8 C), Min:97.7 F (36.5 C), Max:98.9 F (37.2 C)  Recent Labs  Lab 04/15/19 1341 04/21/19 1722 04/21/19 2229  WBC 10.5 17.6*  --   CREATININE 0.77 1.51*  --   LATICACIDVEN  --   --  2.7*    Estimated Creatinine Clearance: 26.3 mL/min (A) (by C-G formula based on SCr of 1.51 mg/dL (H)).    No Known Allergies  Elicia Lamp, PharmD, BCPS Please check AMION for all [place] contact numbers Clinical Pharmacist 04/22/2019 12:00 PM

## 2019-04-22 NOTE — Consult Note (Signed)
Chief Complaint   Chief Complaint  Patient presents with   Hypotension    HPI   Consult requested by: Dr Tamala Julian, [facility] Reason for consult: lumbar spinal stenosis  HPI: Vernon Kyle MEO Sr. is a 85 y.o. male with history of HTN and HLD who was brought to [facility] ER yesterday by EMS due to low SBP. By report, patient was being taken home from an orthopedic appointment by EMS when BP was noted to be low. Upon EDP exam, patient was complaining of generalized weakness in BLE x2 weeks. He was recently seen in the ED for urinary retention at which time a foley catheter was placed on 12/23. EDP was able to talk to son who lives in [place] and he reports patient has been gradually getting weaker over the last 3 years but certainly got noticeably weaker over the last two days. Due to weakness in legs with recent urinary retention, MRI was ordered and obtained. Patient was transferred to [facility] for MRI. MRI of the thoracic and lumbar spine was obtained revealing multilevel degenerative changes and lumbar spinal stenosis. NS consultation was requested.  Patient is currently upset that he is still sitting in the ED. Reassurance provided. He does endorse gradual progressive weakness in BLE over the past 2-3 weeks. He tells me he is certainly not interested in any sort of neurosurgical procedure given age, regardless.  Did discuss with son. Patient has been ambulatory with walker until 2-3 weeks ago. Now struggling to care for self.  Of note, patient is admitted and undergoing work up for possible sepsis with WBC 17.6, lactic acid 2.7. Also noted AKI, multiple pressure ulcers.  Patient Active Problem List   Diagnosis Date Noted   Pressure injury of skin 04/22/2019   Lower extremity weakness 04/22/2019   Sepsis ([place]) 04/22/2019   AKI (acute kidney injury) ([place]) 04/22/2019   Spinal stenosis 04/22/2019   Degenerative disc disease, lumbar 04/22/2019   Hypoalbuminemia 04/22/2019   Syncope 09/01/2016    Symptomatic bradycardia 09/01/2016   Hypertension 09/01/2016   HLD (hyperlipidemia) 09/01/2016   Anemia 09/01/2016    PMH: Past Medical History:  Diagnosis Date   Arthritis    Hyperlipidemia    Hypertension     PSH: Past Surgical History:  Procedure Laterality Date   APPENDECTOMY     HERNIA REPAIR     JOINT REPLACEMENT Bilateral    knee     (Not in a hospital admission)   SH: Social History   Tobacco Use   Smoking status: Never Smoker  Substance Use Topics   Alcohol use: Not on file   Drug use: Not on file    MEDS: Prior to Admission medications   Medication Sig Start Date End Date Taking? Authorizing Provider  cephALEXin (KEFLEX) 250 MG capsule Take 250 mg by mouth 3 (three) times daily. 04/13/19  Yes [provider]  valsartan (DIOVAN) 320 MG tablet Take 1 tablet (320 mg total) by mouth daily. 09/03/16 04/21/19  Aline August, MD    ALLERGY: No Known Allergies  Social History   Tobacco Use   Smoking status: Never Smoker  Substance Use Topics   Alcohol use: Not on file     No family history on file.   ROS   Review of Systems  Constitutional: Negative.   HENT: Negative.   Eyes: Negative.   Respiratory: Negative.   Cardiovascular: Negative.   Gastrointestinal: Negative.   Genitourinary: Negative.   Musculoskeletal: Negative.   Skin: Negative.   Neurological:  Positive for weakness (BLE). Negative for dizziness, tingling, tremors, sensory change, speech change, focal weakness, seizures, loss of consciousness and headaches.    Exam   Vitals:   04/22/19 1623 04/22/19 1820  BP: 105/81 91/63  Pulse: 67   Resp: 14   Temp:    SpO2: 97% 97%   General appearance: elderly male, resting comfortably Eyes: No scleral injection Cardiovascular: Regular rate and rhythm without murmurs, rubs, gallops. No edema or variciosities. Distal pulses normal. Pulmonary: Effort normal, non-labored breathing Musculoskeletal:     Muscle  tone upper extremities: Normal    Muscle tone lower extremities: Normal    Motor exam: Upper Extremities Deltoid Bicep Tricep Grip  Right 5/5 5/5 5/5 5/5  Left 5/5 5/5 5/5 5/5   Lower Extremity IP Quad PF DF EHL  Right 3/5 4/5 4/5 4/5 4/5  Left 3/5 4/5 4/5 4/5 4/5   Neurological Mental Status:    - Patient is awake, alert, oriented to self    - No signs of aphasia or neglect Cranial Nerves    - II: Visual Fields are full. PERRL    - III/IV/VI: EOMI without ptosis or diploplia.     - V: Facial sensation is grossly normal    - VII: Facial movement is symmetric.     - VIII: hearing is intact to voice    - X: Uvula elevates symmetrically    - XI: Shoulder shrug is symmetric.    - XII: tongue is midline without atrophy or fasciculations.  Sensory: Sensation grossly intact to LT Deep Tendon Reflexes    - diminished BLE reflexes  Results - Imaging/Labs   Results for orders placed or performed during the hospital encounter of 04/21/19 (from the past 48 hour(s))  CBC     Status: Abnormal   Collection Time: 04/21/19  5:22 PM  Result Value Ref Range   WBC 17.6 (H) 4.0 - 10.5 K/uL   RBC 3.94 (L) 4.22 - 5.81 MIL/uL   Hemoglobin 13.0 13.0 - 17.0 g/dL   HCT 16.1 09.6 - 04.5 %   MCV 103.0 (H) 80.0 - 100.0 fL   MCH 33.0 26.0 - 34.0 pg   MCHC 32.0 30.0 - 36.0 g/dL   RDW 40.9 81.1 - 91.4 %   Platelets 226 150 - 400 K/uL   nRBC 0.0 0.0 - 0.2 %    Comment: Performed at [facility], [address], [place], [place] [address]  CMP     Status: Abnormal   Collection Time: 04/21/19  5:22 PM  Result Value Ref Range   Sodium 138 135 - 145 mmol/L   Potassium 4.7 3.5 - 5.1 mmol/L   Chloride 100 98 - 111 mmol/L   CO2 24 22 - 32 mmol/L   Glucose, Bld 105 (H) 70 - 99 mg/dL   BUN 53 (H) 8 - 23 mg/dL   Creatinine, Ser 6.21 (H) 0.61 - 1.24 mg/dL   Calcium 8.4 (L) 8.9 - 10.3 mg/dL   Total Protein 6.4 (L) 6.5 - 8.1 g/dL   Albumin 2.6 (L) 3.5 - 5.0 g/dL   AST 29 15 - 41 U/L    ALT 19 0 - 44 U/L   Alkaline Phosphatase 53 38 - 126 U/L   Total Bilirubin 1.1 0.3 - 1.2 mg/dL   GFR calc non Af Amer 38 (L) >60 mL/min   GFR calc Af Amer 44 (L) >60 mL/min   Anion gap 14 5 - 15    Comment: Performed  at [facility], [address] [place], [place] [address]  Urinalysis     Status: Abnormal   Collection Time: 04/21/19  5:22 PM  Result Value Ref Range   Color, Urine YELLOW YELLOW   APPearance HAZY (A) CLEAR   Specific Gravity, Urine 1.017 1.005 - 1.030   pH 5.0 5.0 - 8.0   Glucose, UA NEGATIVE NEGATIVE mg/dL   Hgb urine dipstick SMALL (A) NEGATIVE   Bilirubin Urine NEGATIVE NEGATIVE   Ketones, ur NEGATIVE NEGATIVE mg/dL   Protein, ur 30 (A) NEGATIVE mg/dL   Nitrite NEGATIVE NEGATIVE   Leukocytes,Ua NEGATIVE NEGATIVE   RBC / HPF 6-10 0 - 5 RBC/hpf   WBC, UA 0-5 0 - 5 WBC/hpf   Bacteria, UA RARE (A) NONE SEEN   Squamous Epithelial / LPF 0-5 0 - 5   Mucus PRESENT    Hyaline Casts, UA PRESENT     Comment: Performed at [facility], [address], [place], [place] [address]  Blood Culture x 1     Status: None (Preliminary result)   Collection Time: 04/21/19  5:22 PM   Specimen: BLOOD  Result Value Ref Range   Specimen Description      BLOOD LEFT ANTECUBITAL Performed at [facility], [address], [place], [place] [address]    Special Requests      BOTTLES DRAWN AEROBIC AND ANAEROBIC Blood Culture results may not be optimal due to an inadequate volume of blood received in culture bottles Performed at [facility], [address], [place], [place] [address]    Culture      NO GROWTH < 24 HOURS Performed at [facility], [address] [place], [place] [address]    Report Status PENDING   SARS CORONAVIRUS 2 (TAT 6-24 HRS) Nasopharyngeal Nasopharyngeal Swab     Status: None   Collection Time: 04/21/19  7:15 PM   Specimen: Nasopharyngeal Swab  Result Value Ref  Range   SARS Coronavirus 2 NEGATIVE NEGATIVE    Comment: (NOTE) SARS-CoV-2 target nucleic acids are NOT DETECTED. The SARS-CoV-2 RNA is generally detectable in upper and lower respiratory specimens during the acute phase of infection. Negative results do not preclude SARS-CoV-2 infection, do not rule out co-infections with other pathogens, and should not be used as the sole basis for treatment or other patient management decisions. Negative results must be combined with clinical observations, patient history, and epidemiological information. The expected result is Negative. Fact Sheet for Patients: HairSlick.no Fact Sheet for Healthcare Providers: quierodirigir.com This test is not yet approved or cleared by the [place] FDA and  has been authorized for detection and/or diagnosis of SARS-CoV-2 by FDA under an Emergency Use Authorization (EUA). This EUA will remain  in effect (meaning this test can be used) for the duration of the COVID-19 declaration under Section 56 4(b)(1) of the Act, 21 U.S.C. section 360bbb-3(b)(1), unless the authorization is terminated or revoked sooner. Performed at [facility], [address], [place], [place] [address]   Lactic acid, plasma     Status: Abnormal   Collection Time: 04/21/19 10:29 PM  Result Value Ref Range   Lactic Acid, Venous 2.7 (HH) 0.5 - 1.9 mmol/L    Comment: CRITICAL RESULT CALLED TO, READ BACK BY AND VERIFIED WITH: Barbette Hair B,RN 04/22/19 0220 WAYK Performed at [facility], [address] [place], [place] [address]   Culture, blood (routine x 2)  Status: None (Preliminary result)   Collection Time: 04/22/19  1:22 AM   Specimen: BLOOD LEFT FOREARM  Result Value Ref Range   Specimen Description BLOOD LEFT FOREARM    Special Requests      BOTTLES DRAWN AEROBIC AND ANAEROBIC Blood Culture adequate volume   Culture      NO GROWTH < 12 HOURS Performed at  [facility], [address] [place], [place] [address]    Report Status PENDING   Culture, blood (routine x 2)     Status: None (Preliminary result)   Collection Time: 04/22/19  1:32 AM   Specimen: BLOOD RIGHT FOREARM  Result Value Ref Range   Specimen Description BLOOD RIGHT FOREARM    Special Requests      AEROBIC BOTTLE ONLY Blood Culture results may not be optimal due to an inadequate volume of blood received in culture bottles   Culture      NO GROWTH < 12 HOURS Performed at [facility], [address] [place], [place] [address]    Report Status PENDING   CBC     Status: Abnormal   Collection Time: 04/22/19  1:00 PM  Result Value Ref Range   WBC 16.1 (H) 4.0 - 10.5 K/uL   RBC 3.95 (L) 4.22 - 5.81 MIL/uL   Hemoglobin 13.1 13.0 - 17.0 g/dL   HCT 09.840.3 11.939.0 - 14.752.0 %   MCV 102.0 (H) 80.0 - 100.0 fL   MCH 33.2 26.0 - 34.0 pg   MCHC 32.5 30.0 - 36.0 g/dL   RDW 82.912.3 56.211.5 - 13.015.5 %   Platelets 231 150 - 400 K/uL   nRBC 0.0 0.0 - 0.2 %    Comment: Performed at [facility], [address] [place], [place] [address]  Lactic acid, plasma     Status: Abnormal   Collection Time: 04/22/19  1:00 PM  Result Value Ref Range   Lactic Acid, Venous 2.7 (HH) 0.5 - 1.9 mmol/L    Comment: CRITICAL VALUE NOTED.  VALUE IS CONSISTENT WITH PREVIOUSLY REPORTED AND CALLED VALUE. Performed at [facility], [address] [place], [place] [address]   Prealbumin     Status: Abnormal   Collection Time: 04/22/19  1:00 PM  Result Value Ref Range   Prealbumin 5.7 (L) 18 - 38 mg/dL    Comment: Performed at [facility], [address] [place], [place] [address]  Sedimentation rate     Status: Abnormal   Collection Time: 04/22/19  1:00 PM  Result Value Ref Range   Sed Rate 68 (H) 0 - 16 mm/hr    Comment: Performed at [facility], [address] [place], [place] [address]  C-reactive protein     Status: Abnormal   Collection Time: 04/22/19  1:00 PM  Result  Value Ref Range   CRP 14.6 (H) <1.0 mg/dL    Comment: Performed at [facility], [address] [place], [place] [address]  Basic metabolic panel     Status: Abnormal   Collection Time: 04/22/19  1:00 PM  Result Value Ref Range   Sodium 136 135 - 145 mmol/L   Potassium 3.9 3.5 - 5.1 mmol/L   Chloride 103 98 - 111 mmol/L   CO2 19 (L) 22 - 32 mmol/L   Glucose, Bld 115 (H) 70 - 99 mg/dL   BUN 49 (H) 8 - 23 mg/dL   Creatinine, Ser 2.531.13 0.61 - 1.24 mg/dL  Calcium 8.5 (L) 8.9 - 10.3 mg/dL   GFR calc non Af Amer 54 (L) >60 mL/min   GFR calc Af Amer >60 >60 mL/min   Anion gap 14 5 - 15    Comment: Performed at [facility], [address], [place], [place] [address]  CK     Status: None   Collection Time: 04/22/19  5:55 PM  Result Value Ref Range   Total CK 295 49 - 397 U/L    Comment: Performed at [facility], [address] [place], [place] [address]    MR THORACIC SPINE WO CONTRAST  Result Date: 04/22/2019 CLINICAL DATA:  Hypotension. Recent compression fracture with urinary retention. EXAM: MRI THORACIC AND LUMBAR SPINE WITHOUT CONTRAST TECHNIQUE: Multiplanar and multiecho pulse sequences of the thoracic and lumbar spine were obtained without intravenous contrast. COMPARISON:  CT of the thoracic and lumbar spine 04/15/2019 FINDINGS: MRI THORACIC SPINE FINDINGS Alignment:  Scoliosis and exaggerated kyphosis. Vertebrae: Remote T5 compression fracture with moderate height loss. No acute fracture, discitis, or aggressive bone lesion. There is incomplete coverage of T1 due to patient spinal curvature. Cord:  Normal signal and morphology. Paraspinal and other soft tissues: Large hiatal hernia which is right eccentric. Small layering right pleural effusion Disc levels: Generalized disc narrowing and bulging. Generalized degenerative facet spurring and lower thoracic ligamentous thickening. MRI LUMBAR SPINE FINDINGS Segmentation:  5 lumbar type vertebral bodies Alignment:  Degenerative reversal of normal lumbar lordosis with degenerative grade 1 anterolisthesis at L4-5 and T12-L1. Vertebrae: No evidence of fracture, discitis, or aggressive bone lesion. Fluid within the L4-5 and L5-S1 disc spaces where there is prominent vacuum phenomenon on prior CT. No endplate destruction or marrow edema Conus medullaris and cauda equina: Conus extends to the L1-2 level. Conus and cauda equina appear normal. Paraspinal and other soft tissues: Renal cystic intensities, large on the right. Muscular atrophy. Disc levels: T12- L1: Facet degeneration with spurring and mild anterolisthesis. The disc is narrowed and bulging with right paracentral protrusion. High-grade right foraminal stenosis. Mild spinal stenosis L1-L2: Intervertebral ankylosis. The disc space is severely narrowed and there is endplate and asymmetric left facet ridging. Moderate left foraminal narrowing L2-L3: Intervertebral ankylosis. Endplate and facet spurring more notable on the right where there is mild-to-moderate foraminal stenosis. Left subarticular recess narrowing which likely affects the descending L3 nerve root L3-L4: Intervertebral ankylosis. There is endplate and facet spurring asymmetric to the right where there is foraminal impingement. Left foraminal stenosis is moderate to high-grade. High-grade spinal stenosis. L4-L5: Severe facet degeneration with spurring and anterolisthesis. Narrow disc with complete disc space obliteration and fluid within the large fissure. Remarkably severe spinal stenosis with no visible residual thecal sac. Severe right foraminal stenosis with L4 compression L5-S1:Advanced disc degeneration with large fluid containing cleft. There is endplate and facet spurring. Moderate left foraminal impingement. IMPRESSION: MR THORACIC SPINE IMPRESSION No acute finding or cord impingement. The T5 compression fracture on prior CT is remote and healed. MR LUMBAR SPINE IMPRESSION 1. Severe degenerative disease  with kyphoscoliosis and L4-5 listhesis. There is intervertebral ankylosis or solid fusion at L1-L4. 2. L4-5 severe spinal stenosis and right foraminal impingement. 3. L3-4 high-grade spinal stenosis and right foraminal impingement 4. L2-3 left foraminal and subarticular recess stenosis likely affecting the descending L3 nerve root. 5. T12-L1 advanced right foraminal stenosis. Electronically Signed   By: Marnee Spring M.D.   On: 04/22/2019 06:05   MR LUMBAR SPINE WO CONTRAST  Result Date: 04/22/2019 CLINICAL  DATA:  Hypotension. Recent compression fracture with urinary retention. EXAM: MRI THORACIC AND LUMBAR SPINE WITHOUT CONTRAST TECHNIQUE: Multiplanar and multiecho pulse sequences of the thoracic and lumbar spine were obtained without intravenous contrast. COMPARISON:  CT of the thoracic and lumbar spine 04/15/2019 FINDINGS: MRI THORACIC SPINE FINDINGS Alignment:  Scoliosis and exaggerated kyphosis. Vertebrae: Remote T5 compression fracture with moderate height loss. No acute fracture, discitis, or aggressive bone lesion. There is incomplete coverage of T1 due to patient spinal curvature. Cord:  Normal signal and morphology. Paraspinal and other soft tissues: Large hiatal hernia which is right eccentric. Small layering right pleural effusion Disc levels: Generalized disc narrowing and bulging. Generalized degenerative facet spurring and lower thoracic ligamentous thickening. MRI LUMBAR SPINE FINDINGS Segmentation:  5 lumbar type vertebral bodies Alignment: Degenerative reversal of normal lumbar lordosis with degenerative grade 1 anterolisthesis at L4-5 and T12-L1. Vertebrae: No evidence of fracture, discitis, or aggressive bone lesion. Fluid within the L4-5 and L5-S1 disc spaces where there is prominent vacuum phenomenon on prior CT. No endplate destruction or marrow edema Conus medullaris and cauda equina: Conus extends to the L1-2 level. Conus and cauda equina appear normal. Paraspinal and other soft  tissues: Renal cystic intensities, large on the right. Muscular atrophy. Disc levels: T12- L1: Facet degeneration with spurring and mild anterolisthesis. The disc is narrowed and bulging with right paracentral protrusion. High-grade right foraminal stenosis. Mild spinal stenosis L1-L2: Intervertebral ankylosis. The disc space is severely narrowed and there is endplate and asymmetric left facet ridging. Moderate left foraminal narrowing L2-L3: Intervertebral ankylosis. Endplate and facet spurring more notable on the right where there is mild-to-moderate foraminal stenosis. Left subarticular recess narrowing which likely affects the descending L3 nerve root L3-L4: Intervertebral ankylosis. There is endplate and facet spurring asymmetric to the right where there is foraminal impingement. Left foraminal stenosis is moderate to high-grade. High-grade spinal stenosis. L4-L5: Severe facet degeneration with spurring and anterolisthesis. Narrow disc with complete disc space obliteration and fluid within the large fissure. Remarkably severe spinal stenosis with no visible residual thecal sac. Severe right foraminal stenosis with L4 compression L5-S1:Advanced disc degeneration with large fluid containing cleft. There is endplate and facet spurring. Moderate left foraminal impingement. IMPRESSION: MR THORACIC SPINE IMPRESSION No acute finding or cord impingement. The T5 compression fracture on prior CT is remote and healed. MR LUMBAR SPINE IMPRESSION 1. Severe degenerative disease with kyphoscoliosis and L4-5 listhesis. There is intervertebral ankylosis or solid fusion at L1-L4. 2. L4-5 severe spinal stenosis and right foraminal impingement. 3. L3-4 high-grade spinal stenosis and right foraminal impingement 4. L2-3 left foraminal and subarticular recess stenosis likely affecting the descending L3 nerve root. 5. T12-L1 advanced right foraminal stenosis. Electronically Signed   By: Marnee Spring M.D.   On: 04/22/2019 06:05    XR Chest Single View  Result Date: 04/21/2019 CLINICAL DATA:  Hypotension EXAM: PORTABLE CHEST 1 VIEW COMPARISON:  None. FINDINGS: Enlarged cardiac silhouette that could be due to cardiomegaly and/or pericardial fluid. Retrocardiac density presumed associated with a hiatal hernia. Aortic atherosclerosis. The lungs are clear. No evidence of heart failure or effusion. IMPRESSION: Enlarged cardiac silhouette that could be cardiomegaly and/or pericardial fluid. Probable hiatal hernia. Aortic atherosclerosis. Lungs clear.  No edema. Electronically Signed   By: Paulina Fusi M.D.   On: 04/21/2019 17:51   Impression/Plan   85 y.o. male with gradual, progressive weakness in BLE with urinary retention s/p placement of foley, which fairly severe multilevel lumbar spinal stenosis. He is also being worked  up for sepsis of unknown origin by primary team.    I had a long discussion with the patient regarding the MRI findings. In normal circumstances, we would recommend multilevel lumbar decompression, however, given his advanced age, surgery certainly carries significant risk. After discussion, patient is adamant about not proceeding with any type of neurosurgical intervention. I believe this is appropriate. I also discussed the above with the son who is also in agreement with not proceeding with surgery. He believes patient will need SNF placement. PT/OT consults. Hopefully as patient is being treated for possible sepsis, AKI and works with therapy, his strength will improve.   Will sign off. Please reconsult as necessary.  Cindra Presume, PA-C [facility]

## 2019-04-22 NOTE — H&P (Addendum)
History and Physical    Vernon MILLEA Sr. BSW:[id number] DOB: [birth year] DOA: 04/21/2019  Referring MD/NP/PA: Gean Birchwood, MD PCP: Deland Pretty, MD  Patient coming from: Home via EMS  Chief Complaint: Low blood pressure  I have personally briefly reviewed patient's old medical records in [place]   HPI: Vernon Kyle MEO Sr. is a 85 y.o. male with medical history significant of hypertension, hyperlipidemia, and arthritis.  Patient presents after being found to have low blood pressures after being evaluated at [facility] yesterday.  Systolic blood pressures were reported to be in the 70s by [facility].  He apparently had been having lower extremity weakness over the last 2 weeks with reports of multiple falls at home.  Denies any back pain at this time or any reports of loss of consciousness with his falls.  Last seen in the emergency department for urinary retention on 12/23, where a foley catheter was placed.  At baseline patient lives alone and friend checks on him and bring him meals daily.  Associated symptoms include episodes of bowel incontinence and decreased appetite over the last 2 weeks.  He denies having any chest pain, shortness of breath, abdominal pain, fever, cough, shortness of breath, or dysuria.  ED Course: Upon admission to the emergency department patient was noted to be afebrile, pulse 50-101, respirations 10-25, and all other vital signs relatively maintained.  Labs significant for WBC 17.6, BUN 53, creatinine 1.51, albumin 2.6, and lactic acid 2.7.  Covid screening was negative.  Blood cultures were obtained.  Chest x-ray showed large cardiac silhouette concerning for cardiomegaly versus pericardial fluid and probable hiatal hernia.  MRIs of the thoracic and lumbar spine showed remote T5 compression fracture that was healed, and severe degenerative disc disease with kyphoscoliosis, foraminal stenosis, and areas of impingement. patient had received 1 L normal  saline IV fluids, empiric antibiotics of vancomycin, and cefepime.  Review of Systems  Constitutional: Negative for fever.  HENT: Negative for ear discharge.   Eyes: Negative for photophobia and pain.  Respiratory: Negative for cough and shortness of breath.   Cardiovascular: Negative for chest pain and leg swelling.  Gastrointestinal: Negative for abdominal pain, nausea and vomiting.  Genitourinary: Negative for dysuria.  Musculoskeletal: Positive for falls. Negative for back pain.  Skin: Negative for rash.  Neurological: Positive for weakness. Negative for loss of consciousness.  Endo/Heme/Allergies: Bruises/bleeds easily.  Psychiatric/Behavioral: Negative for hallucinations.    Past Medical History:  Diagnosis Date   Arthritis    Hyperlipidemia    Hypertension     Past Surgical History:  Procedure Laterality Date   APPENDECTOMY     HERNIA REPAIR     JOINT REPLACEMENT Bilateral    knee      reports that he has never smoked. He does not have any smokeless tobacco history on file. No history on file for alcohol and drug.  No Known Allergies  No family history on file.  Prior to Admission medications   Medication Sig Start Date End Date Taking? Authorizing Provider  cephALEXin (KEFLEX) 250 MG capsule Take 250 mg by mouth 3 (three) times daily. 04/13/19  Yes [provider]  valsartan (DIOVAN) 320 MG tablet Take 1 tablet (320 mg total) by mouth daily. 09/03/16 04/21/19  Aline August, MD    Physical Exam:  Constitutional: Elderly male who is otherwise in no acute distress Vitals:   04/21/19 2030 04/21/19 2130 04/22/19 0003 04/22/19 0642  BP: 91/60 (!) 93/51 106/83 (!) 112/55  Pulse:  88 95 96 66  Resp: (!) '25 15 16 16  ' Temp:   97.7 F (36.5 C) 98.9 F (37.2 C)  TempSrc:    Oral  SpO2: 97% 99% 100% 94%   Eyes: PERRL, lids and conjunctivae normal ENMT: Mucous membranes are dry. Posterior pharynx clear of any exudate or lesions.  Hard of  hearing. Neck: normal, supple, no masses, no thyromegaly Respiratory: clear to auscultation bilaterally, no wheezing, no crackles. Normal respiratory effort. No accessory muscle use.  Cardiovascular: Regular rate and rhythm, no murmurs / rubs / gallops. No extremity edema. 2+ pedal pulses. No carotid bruits.  Abdomen: no tenderness, no masses palpated. No hepatosplenomegaly. Bowel sounds positive.  Musculoskeletal: no clubbing / cyanosis.  kyphosis. Skin: no rashes, lesions, ulcers. No induration Neurologic: CN 2-12 grossly intact. Sensation intact, DTR normal. Strength 4/5 in all 4.  Psychiatric: Normal judgment and insight. Alert and oriented x 3. Normal mood.     Labs on Admission: I have personally reviewed following labs and imaging studies  CBC: Recent Labs  Lab 04/15/19 1341 04/21/19 1722  WBC 10.5 17.6*  NEUTROABS 8.7*  --   HGB 12.8* 13.0  HCT 39.3 40.6  MCV 102.3* 103.0*  PLT 237 092   Basic Metabolic Panel: Recent Labs  Lab 04/15/19 1341 04/21/19 1722  NA 138 138  K 3.9 4.7  CL 102 100  CO2 28 24  GLUCOSE 115* 105*  BUN 14 53*  CREATININE 0.77 1.51*  CALCIUM 8.7* 8.4*   GFR: Estimated Creatinine Clearance: 26.3 mL/min (A) (by C-G formula based on SCr of 1.51 mg/dL (H)). Liver Function Tests: Recent Labs  Lab 04/15/19 1341 04/21/19 1722  AST 21 29  ALT 17 19  ALKPHOS 52 53  BILITOT 0.9 1.1  PROT 5.9* 6.4*  ALBUMIN 2.8* 2.6*   No results for input(s): LIPASE, AMYLASE in the last 168 hours. No results for input(s): AMMONIA in the last 168 hours. Coagulation Profile: No results for input(s): INR, PROTIME in the last 168 hours. Cardiac Enzymes: No results for input(s): CKTOTAL, CKMB, CKMBINDEX, TROPONINI in the last 168 hours. BNP (last 3 results) No results for input(s): PROBNP in the last 8760 hours. HbA1C: No results for input(s): HGBA1C in the last 72 hours. CBG: No results for input(s): GLUCAP in the last 168 hours. Lipid Profile: No  results for input(s): CHOL, HDL, LDLCALC, TRIG, CHOLHDL, LDLDIRECT in the last 72 hours. Thyroid Function Tests: No results for input(s): TSH, T4TOTAL, FREET4, T3FREE, THYROIDAB in the last 72 hours. Anemia Panel: No results for input(s): VITAMINB12, FOLATE, FERRITIN, TIBC, IRON, RETICCTPCT in the last 72 hours. Urine analysis:    Component Value Date/Time   COLORURINE YELLOW 04/21/2019 1722   APPEARANCEUR HAZY (A) 04/21/2019 1722   LABSPEC 1.017 04/21/2019 1722   PHURINE 5.0 04/21/2019 1722   GLUCOSEU NEGATIVE 04/21/2019 1722   HGBUR SMALL (A) 04/21/2019 1722   BILIRUBINUR NEGATIVE 04/21/2019 1722   KETONESUR NEGATIVE 04/21/2019 1722   PROTEINUR 30 (A) 04/21/2019 1722   NITRITE NEGATIVE 04/21/2019 1722   LEUKOCYTESUR NEGATIVE 04/21/2019 1722   Sepsis Labs: Recent Results (from the past 240 hour(s))  Urine culture     Status: None   Collection Time: 04/15/19  1:42 PM   Specimen: Urine, Clean Catch  Result Value Ref Range Status   Specimen Description   Final    URINE, CLEAN CATCH Performed at [facility], [place] [address], [place], [place] [address]    Special Requests   Final  NONE Performed at [facility], [place] [address], [place], [place] [address]    Culture   Final    NO GROWTH Performed at [facility], [place] [address], [place], [place] [address]    Report Status 04/16/2019 FINAL  Final  SARS CORONAVIRUS 2 (TAT 6-24 HRS) Nasopharyngeal Nasopharyngeal Swab     Status: None   Collection Time: 04/21/19  7:15 PM   Specimen: Nasopharyngeal Swab  Result Value Ref Range Status   SARS Coronavirus 2 NEGATIVE NEGATIVE Final    Comment: (NOTE) SARS-CoV-2 target nucleic acids are NOT DETECTED. The SARS-CoV-2 RNA is generally detectable in upper and lower respiratory specimens during the acute phase of infection. Negative results do not preclude SARS-CoV-2 infection, do not rule out co-infections with other pathogens, and should  not be used as the sole basis for treatment or other patient management decisions. Negative results must be combined with clinical observations, patient history, and epidemiological information. The expected result is Negative. Fact Sheet for Patients: SugarRoll.be Fact Sheet for Healthcare Providers: https://www.woods-mathews.com/ This test is not yet approved or cleared by the [place] FDA and  has been authorized for detection and/or diagnosis of SARS-CoV-2 by FDA under an Emergency Use Authorization (EUA). This EUA will remain  in effect (meaning this test can be used) for the duration of the COVID-19 declaration under Section 56 4(b)(1) of the Act, 21 U.S.C. section 360bbb-3(b)(1), unless the authorization is terminated or revoked sooner. Performed at [facility], [place] [address], [place], [place] [address]      Radiological Exams on Admission: MR THORACIC SPINE WO CONTRAST  Result Date: 04/22/2019 CLINICAL DATA:  Hypotension. Recent compression fracture with urinary retention. EXAM: MRI THORACIC AND LUMBAR SPINE WITHOUT CONTRAST TECHNIQUE: Multiplanar and multiecho pulse sequences of the thoracic and lumbar spine were obtained without intravenous contrast. COMPARISON:  CT of the thoracic and lumbar spine 04/15/2019 FINDINGS: MRI THORACIC SPINE FINDINGS Alignment:  Scoliosis and exaggerated kyphosis. Vertebrae: Remote T5 compression fracture with moderate height loss. No acute fracture, discitis, or aggressive bone lesion. There is incomplete coverage of T1 due to patient spinal curvature. Cord:  Normal signal and morphology. Paraspinal and other soft tissues: Large hiatal hernia which is right eccentric. Small layering right pleural effusion Disc levels: Generalized disc narrowing and bulging. Generalized degenerative facet spurring and lower thoracic ligamentous thickening. MRI LUMBAR SPINE FINDINGS Segmentation:  5 lumbar type  vertebral bodies Alignment: Degenerative reversal of normal lumbar lordosis with degenerative grade 1 anterolisthesis at L4-5 and T12-L1. Vertebrae: No evidence of fracture, discitis, or aggressive bone lesion. Fluid within the L4-5 and L5-S1 disc spaces where there is prominent vacuum phenomenon on prior CT. No endplate destruction or marrow edema Conus medullaris and cauda equina: Conus extends to the L1-2 level. Conus and cauda equina appear normal. Paraspinal and other soft tissues: Renal cystic intensities, large on the right. Muscular atrophy. Disc levels: T12- L1: Facet degeneration with spurring and mild anterolisthesis. The disc is narrowed and bulging with right paracentral protrusion. High-grade right foraminal stenosis. Mild spinal stenosis L1-L2: Intervertebral ankylosis. The disc space is severely narrowed and there is endplate and asymmetric left facet ridging. Moderate left foraminal narrowing L2-L3: Intervertebral ankylosis. Endplate and facet spurring more notable on the right where there is mild-to-moderate foraminal stenosis. Left subarticular recess narrowing which likely affects the descending L3 nerve root L3-L4: Intervertebral ankylosis. There is endplate and facet spurring asymmetric to the right where there is foraminal impingement. Left foraminal stenosis  is moderate to high-grade. High-grade spinal stenosis. L4-L5: Severe facet degeneration with spurring and anterolisthesis. Narrow disc with complete disc space obliteration and fluid within the large fissure. Remarkably severe spinal stenosis with no visible residual thecal sac. Severe right foraminal stenosis with L4 compression L5-S1:Advanced disc degeneration with large fluid containing cleft. There is endplate and facet spurring. Moderate left foraminal impingement. IMPRESSION: MR THORACIC SPINE IMPRESSION No acute finding or cord impingement. The T5 compression fracture on prior CT is remote and healed. MR LUMBAR SPINE IMPRESSION 1.  Severe degenerative disease with kyphoscoliosis and L4-5 listhesis. There is intervertebral ankylosis or solid fusion at L1-L4. 2. L4-5 severe spinal stenosis and right foraminal impingement. 3. L3-4 high-grade spinal stenosis and right foraminal impingement 4. L2-3 left foraminal and subarticular recess stenosis likely affecting the descending L3 nerve root. 5. T12-L1 advanced right foraminal stenosis. Electronically Signed   By: Monte Fantasia M.D.   On: 04/22/2019 06:05   MR LUMBAR SPINE WO CONTRAST  Result Date: 04/22/2019 CLINICAL DATA:  Hypotension. Recent compression fracture with urinary retention. EXAM: MRI THORACIC AND LUMBAR SPINE WITHOUT CONTRAST TECHNIQUE: Multiplanar and multiecho pulse sequences of the thoracic and lumbar spine were obtained without intravenous contrast. COMPARISON:  CT of the thoracic and lumbar spine 04/15/2019 FINDINGS: MRI THORACIC SPINE FINDINGS Alignment:  Scoliosis and exaggerated kyphosis. Vertebrae: Remote T5 compression fracture with moderate height loss. No acute fracture, discitis, or aggressive bone lesion. There is incomplete coverage of T1 due to patient spinal curvature. Cord:  Normal signal and morphology. Paraspinal and other soft tissues: Large hiatal hernia which is right eccentric. Small layering right pleural effusion Disc levels: Generalized disc narrowing and bulging. Generalized degenerative facet spurring and lower thoracic ligamentous thickening. MRI LUMBAR SPINE FINDINGS Segmentation:  5 lumbar type vertebral bodies Alignment: Degenerative reversal of normal lumbar lordosis with degenerative grade 1 anterolisthesis at L4-5 and T12-L1. Vertebrae: No evidence of fracture, discitis, or aggressive bone lesion. Fluid within the L4-5 and L5-S1 disc spaces where there is prominent vacuum phenomenon on prior CT. No endplate destruction or marrow edema Conus medullaris and cauda equina: Conus extends to the L1-2 level. Conus and cauda equina appear normal.  Paraspinal and other soft tissues: Renal cystic intensities, large on the right. Muscular atrophy. Disc levels: T12- L1: Facet degeneration with spurring and mild anterolisthesis. The disc is narrowed and bulging with right paracentral protrusion. High-grade right foraminal stenosis. Mild spinal stenosis L1-L2: Intervertebral ankylosis. The disc space is severely narrowed and there is endplate and asymmetric left facet ridging. Moderate left foraminal narrowing L2-L3: Intervertebral ankylosis. Endplate and facet spurring more notable on the right where there is mild-to-moderate foraminal stenosis. Left subarticular recess narrowing which likely affects the descending L3 nerve root L3-L4: Intervertebral ankylosis. There is endplate and facet spurring asymmetric to the right where there is foraminal impingement. Left foraminal stenosis is moderate to high-grade. High-grade spinal stenosis. L4-L5: Severe facet degeneration with spurring and anterolisthesis. Narrow disc with complete disc space obliteration and fluid within the large fissure. Remarkably severe spinal stenosis with no visible residual thecal sac. Severe right foraminal stenosis with L4 compression L5-S1:Advanced disc degeneration with large fluid containing cleft. There is endplate and facet spurring. Moderate left foraminal impingement. IMPRESSION: MR THORACIC SPINE IMPRESSION No acute finding or cord impingement. The T5 compression fracture on prior CT is remote and healed. MR LUMBAR SPINE IMPRESSION 1. Severe degenerative disease with kyphoscoliosis and L4-5 listhesis. There is intervertebral ankylosis or solid fusion at L1-L4. 2. L4-5 severe spinal stenosis  and right foraminal impingement. 3. L3-4 high-grade spinal stenosis and right foraminal impingement 4. L2-3 left foraminal and subarticular recess stenosis likely affecting the descending L3 nerve root. 5. T12-L1 advanced right foraminal stenosis. Electronically Signed   By: Monte Fantasia M.D.    On: 04/22/2019 06:05   XR Chest Single View  Result Date: 04/21/2019 CLINICAL DATA:  Hypotension EXAM: PORTABLE CHEST 1 VIEW COMPARISON:  None. FINDINGS: Enlarged cardiac silhouette that could be due to cardiomegaly and/or pericardial fluid. Retrocardiac density presumed associated with a hiatal hernia. Aortic atherosclerosis. The lungs are clear. No evidence of heart failure or effusion. IMPRESSION: Enlarged cardiac silhouette that could be cardiomegaly and/or pericardial fluid. Probable hiatal hernia. Aortic atherosclerosis. Lungs clear.  No edema. Electronically Signed   By: Nelson Chimes M.D.   On: 04/21/2019 17:51    EKG: Independently reviewed.  Sinus rhythm at 96 bpm with PVCs.  Assessment/Plan Suspected sepsis, unknown source: Acute.  On admission patient was noted to be tachypneic with WBC 17.6 and lactic acid 2.7.  Chest x-ray noting cardiomegaly and urinalysis not significant for signs of infection.  COVID-19 screening was negative.  Cultures were obtained and patient has been started on vancomycin and cefepime.  -Admit to a medical telemetry bed -Follow-up blood and urine cultures -Check ESR and CRP -Continue empiric antibiotics of vancomycin and cefepime  -De-escalate when medically appropriate -Trend lactic acid levels -Recheck CBC  Transient hypotension: Acute.  Patient with intermittent low blood pressures as low as 82/44.  Pressures responsive to IV fluids. -IV fluids normal saline at 75 mL/h overnight -Monitor for signs of fluid overload  Bilateral lower extremity weakness: Patient reports being unable to ambulate due to weakness.  MRI notingsevere degenerative disc changes with couple areas of impingement and foraminal stenosis noted of the lumbar spine. -PT/OT to evaluate and treat -Social work consult -Neurosurgery consulted, will follow-up for further recommendations  Acute kidney injury, urinary retention: On admission creatinine 1.51 with BUN 53.  Patient's  baseline creatinine previously had been around 0.7-0.8. The elevated BUN to creatinine ratio suggest prerenal cause of symptoms. -Gentle IV fluids as tolerated -Recheck BMP -Continue Foley catheter  Pressure ulcer: Patient with pressure ulcer of the sacrum present. -Low-air-loss mattress replacement -Wound care consult  Cardiomegaly: Seen on chest x-ray.  Question of pericardial effusion.  Patient does not appear acutely fluid overloaded at this time.  Last EF noted to be 55 to 60% with grade 1 diastolic dysfunction. -Monitor intake and output -Check echocardiogram -Trend cardiac troponin  Degenerative disc disease, spinal stenosis, foraminal impingement:As seen seen on MRI. -Follow-up to see if neurosurgery has any further recommendations  Hypoalbuminemia: On admission initial albumin noted to be 2.6. -Check prealbumin  DVT prophylaxis: Lovenox Code Status: Full  Family Communication: Discussed plan of care with the patient's son over the phone Disposition Plan: To be determined  Consults called: None Admission status: inpatient  Norval Morton MD [facility] Pager [phone]   If 7PM-7AM, please contact night-coverage [email or web address] Password [email or web address]  04/22/2019, 9:04 AM

## 2019-04-22 NOTE — ED Notes (Signed)
Admitting notified regarding pt bp

## 2019-04-22 NOTE — ED Notes (Signed)
Dinner Tray Ordered @ 1712. 

## 2019-04-22 NOTE — Progress Notes (Signed)
Pharmacy Antibiotic Note  JERRIC OYEN MEO Sr. is a 85 y.o. male admitted on 04/21/2019 with cellulitis.  Pharmacy has been consulted for vancomycin and cefepime dosing.  Plan: Vancomycin 1250 mg IV x 1 then f/u renal function for future doses Cefepime 2gm IV x 1 then 1gm IV q24 hours F/u renal function, cultures and clinical course     Temp (24hrs), Avg:98.3 F (36.8 C), Min:97.7 F (36.5 C), Max:98.9 F (37.2 C)  Recent Labs  Lab 04/15/19 1341 04/21/19 1722 04/21/19 2229  WBC 10.5 17.6*  --   CREATININE 0.77 1.51*  --   LATICACIDVEN  --   --  2.7*    Estimated Creatinine Clearance: 26.3 mL/min (A) (by C-G formula based on SCr of 1.51 mg/dL (H)).    No Known Allergies  Thank you for allowing pharmacy to be a part of this patient's care.  Excell Seltzer Poteet 04/22/2019 6:48 AM

## 2019-04-22 NOTE — Progress Notes (Signed)
Pharmacy Antibiotic Note  Vernon Walton Sr. is a 85 y.o. male admitted on 04/21/2019 with infection on unknown source.  Pharmacy has been consulted for vancomycin and cefepime dosing. Patient was given 1x doses in the ER earlier this AM. AKI - SCr 1.51 on 12/29, repeat SCr pending for today.  Plan: F/u renal function to enter further vancomycin maintenance doses Increase cefepime to 2g IV q24h Monitor clinical progress, c/s, renal function F/u de-escalation plan/LOT, vancomycin levels as indicated  ADDENDUM: SCr today improved, down to 1.13  Plan: Continue vancomycin 750 mg IV Q 24 hrs. Goal AUC 400-550. Expected AUC: 456 SCr used: 1.13 Cefepime to 2g IV q12h Monitor clinical progress, c/s, renal function F/u de-escalation plan/LOT, vancomycin levels at steady state        Temp (24hrs), Avg:98.3 F (36.8 C), Min:97.7 F (36.5 C), Max:98.9 F (37.2 C)  Recent Labs  Lab 04/21/19 1722 04/21/19 2229 04/22/19 1300  WBC 17.6*  --  16.1*  CREATININE 1.51*  --  1.13  LATICACIDVEN  --  2.7* 2.7*    Estimated Creatinine Clearance: 35.1 mL/min (by C-G formula based on SCr of 1.13 mg/dL).    No Known Allergies  Elicia Lamp, PharmD, BCPS Please check AMION for all [place] contact numbers Clinical Pharmacist 04/22/2019 2:44 PM

## 2019-04-22 NOTE — ED Provider Notes (Signed)
Patient has now had MRI of thoracic and lumbar spine.  Results do not show any acute findings.  Patient continues to do fairly well here in the ER.  He still has bilateral symmetric weakness of both lower extremities.  He is not able to get up and walk.  Etiology is not clear.  He clearly has evidence of acute kidney injury and dehydration.  He has been given an IV fluid bolus and a drip here in the ER.  In addition to this, patient has significant decubitus ulcers of bilateral buttocks, sacrum, lumbar and thoracic spine.  Patient previously lived with his wife but she is now in a nursing home secondary to dementia.  Patient appears to live alone with some people coming in to bring him meals and help him intermittently.  He cannot be discharged at this time, will admit for further management.   Orpah Greek, MD 04/22/19 [phone]

## 2019-04-22 NOTE — ED Notes (Signed)
Pt taken to MRI; pt upset that he is in the hallway and not in at room

## 2019-04-23 ENCOUNTER — Encounter (HOSPITAL_COMMUNITY): Payer: Self-pay | Admitting: Internal Medicine

## 2019-04-23 ENCOUNTER — Inpatient Hospital Stay (HOSPITAL_COMMUNITY): Payer: Medicare Other

## 2019-04-23 DIAGNOSIS — I350 Nonrheumatic aortic (valve) stenosis: Secondary | ICD-10-CM

## 2019-04-23 DIAGNOSIS — E46 Unspecified protein-calorie malnutrition: Secondary | ICD-10-CM

## 2019-04-23 DIAGNOSIS — I340 Nonrheumatic mitral (valve) insufficiency: Secondary | ICD-10-CM

## 2019-04-23 DIAGNOSIS — L89152 Pressure ulcer of sacral region, stage 2: Secondary | ICD-10-CM

## 2019-04-23 LAB — CBC
HCT: 34.9 % — ABNORMAL LOW (ref 39.0–52.0)
Hemoglobin: 11.4 g/dL — ABNORMAL LOW (ref 13.0–17.0)
MCH: 32.8 pg (ref 26.0–34.0)
MCHC: 32.7 g/dL (ref 30.0–36.0)
MCV: 100.3 fL — ABNORMAL HIGH (ref 80.0–100.0)
Platelets: 219 10*3/uL (ref 150–400)
RBC: 3.48 MIL/uL — ABNORMAL LOW (ref 4.22–5.81)
RDW: 12.4 % (ref 11.5–15.5)
WBC: 11.7 10*3/uL — ABNORMAL HIGH (ref 4.0–10.5)
nRBC: 0 % (ref 0.0–0.2)

## 2019-04-23 LAB — ECHOCARDIOGRAM COMPLETE: Weight: 2352.75 oz

## 2019-04-23 LAB — BASIC METABOLIC PANEL
Anion gap: 10 (ref 5–15)
BUN: 37 mg/dL — ABNORMAL HIGH (ref 8–23)
CO2: 21 mmol/L — ABNORMAL LOW (ref 22–32)
Calcium: 8.1 mg/dL — ABNORMAL LOW (ref 8.9–10.3)
Chloride: 105 mmol/L (ref 98–111)
Creatinine, Ser: 0.94 mg/dL (ref 0.61–1.24)
GFR calc Af Amer: >60 mL/min (ref >60)
GFR calc non Af Amer: >60 mL/min (ref >60)
Glucose, Bld: 117 mg/dL — ABNORMAL HIGH (ref 70–99)
Potassium: 3.7 mmol/L (ref 3.5–5.1)
Sodium: 136 mmol/L (ref 135–145)

## 2019-04-23 MED ORDER — ENSURE ENLIVE PO LIQD
237.0000 mL | Freq: Two times a day (BID) | ORAL | Status: DC
  Administered 2019-04-23 – 2019-04-28 (×10): 237 mL via ORAL
  Filled 2019-04-23 (×5): qty 237

## 2019-04-23 MED ORDER — ENOXAPARIN SODIUM 40 MG/0.4ML ~~LOC~~ SOLN
40.0000 mg | SUBCUTANEOUS | Status: DC
  Administered 2019-04-24 – 2019-04-28 (×5): 40 mg via SUBCUTANEOUS
  Filled 2019-04-23 (×5): qty 0.4

## 2019-04-23 NOTE — Progress Notes (Signed)
Followed up with portables, no air loss mattress available at this time  Will continue to follow up

## 2019-04-23 NOTE — Progress Notes (Signed)
Initial Nutrition Assessment  DOCUMENTATION CODES:   Not applicable  INTERVENTION:  Provide Ensure Enlive po BID, each supplement provides 350 kcal and 20 grams of protein  Encourage adequate PO intake.   NUTRITION DIAGNOSIS:   Increased nutrient needs related to wound healing as evidenced by estimated needs.  GOAL:   Patient will meet greater than or equal to 90% of their needs  MONITOR:   PO intake, Supplement acceptance, Skin, Weight trends, Labs, I & O's  REASON FOR ASSESSMENT:   Consult Wound healing  ASSESSMENT:   85 y.o. male with medical history significant of hypertension, hyperlipidemia, and arthritis.  Patient presents after being found to have low blood pressures and progressive weakness. MRIs of the thoracic and lumbar spine showed remote T5 compression fracture that was healed, and severe degenerative disc disease with kyphoscoliosis, foraminal stenosis, and areas of impingement.  Pt reports having a good appetite currently and PTA with usual consumption of at least 3 meals a day with no difficulties. Pt actively consuming lunch tray during RD visit. RD to order nutritional supplements to aid in caloric and protein needs as well as in wound healing. Pt encouraged to eat his food at meals and to drink his supplements. Labs and medications reviewed.   NUTRITION - FOCUSED PHYSICAL EXAM:    Most Recent Value  Orbital Region  Unable to assess  Upper Arm Region  No depletion  Thoracic and Lumbar Region  No depletion  Buccal Region  Unable to assess  Temple Region  Unable to assess  Clavicle Bone Region  Mild depletion  Clavicle and Acromion Bone Region  No depletion  Scapular Bone Region  Unable to assess  Dorsal Hand  No depletion  Patellar Region  No depletion  Anterior Thigh Region  No depletion  Posterior Calf Region  No depletion  Edema (RD Assessment)  Mild  Hair  Reviewed  Eyes  Reviewed  Mouth  Reviewed  Skin  Reviewed  Nails  Reviewed        Diet Order:   Diet Order            Diet Heart Room service appropriate? Yes; Fluid consistency: Thin  Diet effective now              EDUCATION NEEDS:   Not appropriate for education at this time  Skin:  Skin Assessment: Skin Integrity Issues: Skin Integrity Issues:: Unstageable, Stage I Stage I: R shoulder Unstageable: sacrum  Last BM:  12/31  Height:   Ht Readings from Last 1 Encounters:  04/15/19 5\' 8"  (1.727 m)    Weight:   Wt Readings from Last 1 Encounters:  04/23/19 66.7 kg    Ideal Body Weight:  70 kg  BMI:  Body mass index is 22.36 kg/m.  Estimated Nutritional Needs:   Kcal:  1600-1800  Protein:  70-85 grams  Fluid:  >/= 1.6 L/day    Corrin Parker, MS, RD, LDN Pager # [phone] After hours/ weekend pager # [phone]

## 2019-04-23 NOTE — Consult Note (Signed)
WOC Nurse Consult Note: Reason for Consult: Pressure injuries to sacrum, mid back. I am assisted in my consultation by Bedside RN, Cyril Mourning who is additionally assisted by PT for mobilizing patient in bed due to weakness. Wound type:Pressure and pressure plus moisture Pressure Injury POA: Yes Measurement: Bedside RN, Kristen to measure wounds today and document on Nursing Flow Sheet Wound bed:  Stage 1 PI to upper thoracic area (nonblanchable erythema) measuring 2.5cm round. No open area, no drainage.  Will cover with silicone foam. Stage 2 PIs (3) to sacral area, partial thickness. Similar in size and shape, all measure approximately 2cm round with pink and yellow wound bed.  Most distal has shearing of epidermis and is 100% red, moist with small amount of serous exudate. Drainage (amount, consistency, odor) As noted above Periwound: Intact, dry Dressing procedure/placement/frequency: I have provided Nursing with Orders for the patient mattress replacement with low air loss feature while in bed and a pressure redistribution chair cushion for his use while OOB to chair. Patient to take this cushion with him upon discharge. Heels are to be floated to prevent pressure injury. As he is mobile, I did not opt for pressure redistribution heel boots at this time.  Topical care will be with silicone foam dressings. Turning and repositioning is in place per house protocol. Nutritional consult is recommended and Order placed. I have requested an image of the sacral wounds to be placed in the EMR by Dr Maryland Pink via Secure Chart.   [facility] nursing team will not follow, but will remain available to this patient, the nursing and medical teams.  Please re-consult if needed.  Thank you for inviting [place] to consult on this patient.  Maudie Flakes, MSN, RN, [place], [facility]  Pager# [phone]

## 2019-04-23 NOTE — Progress Notes (Signed)
PROGRESS NOTE  Vernon KELEMEN Sr. IOX:[id number] DOB: 28-Dec-1918 DOA: 04/21/2019 PCP: Deland Pretty, MD  HPI/Recap of past 75 hours: 85 year old male with past medical history of hypertension and DJD who is evaluated for lower extremity weakness at [facility] orthopedics on 12/29 and was to have low blood pressure, so he was sent over to the emergency room for further evaluation.  Reportedly, blood pressures were in the 26S systolic.  Patient has been seen in the emergency room on 12/23 for urinary retention at that time Foley catheter was placed.  In the meantime, he had some lower extremity weakness over the past 2 weeks with reports of multiple falls at home along with some associated symptoms of bowel incontinence and decreased appetite.  In the emergency room, patient noted to have signs consistent with infection as well as an MRI of the thoracic and lumbar spine noted severe degenerative disease and foraminal stenosis with areas of impingement.  Neurosurgery consulted.  Patient started on IV fluids and antibiotics and admitted to the hospitalist service.  After neurosurgery follow-up, patient told him that he was adamant about not wanting any kind of surgical intervention.  He understood that because of his severe spinal stenosis, he likely will not walk again and he was amenable to going to a rehab facility.    Patient doing okay, complains of being a little bit fatigued.  Assessment/Plan: Principal Problem: \ decubitus ulceration infection: No evidence of UTI seen despite urinary catheter placement last week.  Sepsis ruled out.  Source of infection may be decubitus ulceration.  Continue antibiotics and wound care.  No evidence of pneumonia. Active Problems:   Hypertension   Anemia: Secondary to chronic medical issues.  Chronic diastolic heart failure: Cardiomegaly seen on x-ray.  Patient had an echocardiogram done 12/31 noting chronic diastolic disease.  Urinary retention: Status post  catheter placement.    AKI (acute kidney injury) ([place]): Mild, secondary dehydration.  Creatinine normalized with IV fluids today.  Lactic acidosis: No evidence of sepsis.  This is likely more secondary to dehydration and intravascular volume depletion.  Has since resolved.  Lumbar spinal stenosis causing lower extremity weakness, bowel/bladder incontinence issues: With discussion, patient does not want any type of surgical intervention.  PT and OT to see with plans for skilled nursing.    Sacral decubitus ulcer, stage II [facility]): Present on admission.  We will try to get picture for record.  Wound care following.  Patient will continue to have worsening issues given his bedbound status.    Protein calorie malnutrition ([place]): Has had decreased p.o. intake as of late.  Albumin at 2.6 and he has a BMI of 22.  Nutrition to see.   Code Status: Full code, given need for no plans for surgical intervention, skilled nursing placement, advanced age, palliative care consult will be appropriate for goals of care  Family Communication: Left message with son  Disposition Plan: Skilled nursing   Consultants:  Neurosurgery  Palliative care  Procedures:  Echocardiogram done 12/31: Preserved ejection fraction, diastolic dysfunction  Antimicrobials:  IV cefepime and vancomycin 12/30-present  DVT prophylaxis: Lovenox   Objective: Vitals:   04/23/19 0940 04/23/19 1227  BP: (!) 154/124 100/60  Pulse: 78 90  Resp:  18  Temp:  98.7 F (37.1 C)  SpO2:  99%    Intake/Output Summary (Last 24 hours) at 04/23/2019 1357 Last data filed at 04/23/2019 0400 Gross per 24 hour  Intake 800 ml  Output --  Net 800 ml  Filed Weights   04/23/19 0500 04/23/19 0549  Weight: 65.5 kg 66.7 kg   Body mass index is 22.36 kg/m.  Exam:   General: Alert and oriented x2, no acute distress  HEENT: Normocephalic and atraumatic, mucous memories slightly dry  Cardiovascular: Regular rate and rhythm,  S1-S2, 3 out of 6 systolic ejection murmur  Respiratory: Clear to auscultation bilaterally  Abdomen: Soft, nontender, nondistended, positive bowel sounds  Musculoskeletal: No clubbing or cyanosis, 1+ pitting edema bilaterally  Skin: Decubitus ulcers on backside  Psychiatry: Appropriate, no evidence of psychoses   Data Reviewed: CBC: Recent Labs  Lab 04/21/19 1722 04/22/19 1300 04/23/19 0516  WBC 17.6* 16.1* 11.7*  HGB 13.0 13.1 11.4*  HCT 40.6 40.3 34.9*  MCV 103.0* 102.0* 100.3*  PLT 226 231 219   Basic Metabolic Panel: Recent Labs  Lab 04/21/19 1722 04/22/19 1300 04/23/19 0516  NA 138 136 136  K 4.7 3.9 3.7  CL 100 103 105  CO2 24 19* 21*  GLUCOSE 105* 115* 117*  BUN 53* 49* 37*  CREATININE 1.51* 1.13 0.94  CALCIUM 8.4* 8.5* 8.1*   GFR: Estimated Creatinine Clearance: 41.4 mL/min (by C-G formula based on SCr of 0.94 mg/dL). Liver Function Tests: Recent Labs  Lab 04/21/19 1722  AST 29  ALT 19  ALKPHOS 53  BILITOT 1.1  PROT 6.4*  ALBUMIN 2.6*   No results for input(s): LIPASE, AMYLASE in the last 168 hours. No results for input(s): AMMONIA in the last 168 hours. Coagulation Profile: No results for input(s): INR, PROTIME in the last 168 hours. Cardiac Enzymes: Recent Labs  Lab 04/22/19 1755  CKTOTAL 295   BNP (last 3 results) No results for input(s): PROBNP in the last 8760 hours. HbA1C: No results for input(s): HGBA1C in the last 72 hours. CBG: No results for input(s): GLUCAP in the last 168 hours. Lipid Profile: No results for input(s): CHOL, HDL, LDLCALC, TRIG, CHOLHDL, LDLDIRECT in the last 72 hours. Thyroid Function Tests: No results for input(s): TSH, T4TOTAL, FREET4, T3FREE, THYROIDAB in the last 72 hours. Anemia Panel: No results for input(s): VITAMINB12, FOLATE, FERRITIN, TIBC, IRON, RETICCTPCT in the last 72 hours. Urine analysis:    Component Value Date/Time   COLORURINE YELLOW 04/21/2019 1722   APPEARANCEUR HAZY (A) 04/21/2019  1722   LABSPEC 1.017 04/21/2019 1722   PHURINE 5.0 04/21/2019 1722   GLUCOSEU NEGATIVE 04/21/2019 1722   HGBUR SMALL (A) 04/21/2019 1722   BILIRUBINUR NEGATIVE 04/21/2019 1722   KETONESUR NEGATIVE 04/21/2019 1722   PROTEINUR 30 (A) 04/21/2019 1722   NITRITE NEGATIVE 04/21/2019 1722   LEUKOCYTESUR NEGATIVE 04/21/2019 1722   Sepsis Labs: @LABRCNTIP (procalcitonin:4,lacticidven:4)  ) Recent Results (from the past 240 hour(s))  Urine culture     Status: None   Collection Time: 04/15/19  1:42 PM   Specimen: Urine, Clean Catch  Result Value Ref Range Status   Specimen Description   Final    URINE, CLEAN CATCH Performed at [facility], [address], [place], [place]    Special Requests   Final    NONE Performed at [facility], [address] [place], [place]    Culture   Final    NO GROWTH Performed at [facility], [address], [place], [place]    Report Status 04/16/2019 FINAL  Final  Blood Culture x 1     Status: None (Preliminary result)   Collection Time: 04/21/19  5:22 PM   Specimen:  BLOOD  Result Value Ref Range Status   Specimen Description   Final    BLOOD LEFT ANTECUBITAL Performed at [facility], [address], [place], [place] [address]    Special Requests   Final    BOTTLES DRAWN AEROBIC AND ANAEROBIC Blood Culture results may not be optimal due to an inadequate volume of blood received in culture bottles Performed at [facility], [address], [place], [place] [address]    Culture   Final    NO GROWTH 2 DAYS Performed at [facility], [address], [place], [place] [address]    Report Status PENDING  Incomplete  SARS CORONAVIRUS 2 (TAT 6-24 HRS) Nasopharyngeal Nasopharyngeal Swab     Status: None   Collection Time: 04/21/19  7:15 PM   Specimen: Nasopharyngeal Swab  Result Value Ref Range Status   SARS Coronavirus 2  NEGATIVE NEGATIVE Final    Comment: (NOTE) SARS-CoV-2 target nucleic acids are NOT DETECTED. The SARS-CoV-2 RNA is generally detectable in upper and lower respiratory specimens during the acute phase of infection. Negative results do not preclude SARS-CoV-2 infection, do not rule out co-infections with other pathogens, and should not be used as the sole basis for treatment or other patient management decisions. Negative results must be combined with clinical observations, patient history, and epidemiological information. The expected result is Negative. Fact Sheet for Patients: HairSlick.no Fact Sheet for Healthcare Providers: quierodirigir.com This test is not yet approved or cleared by the [place] FDA and  has been authorized for detection and/or diagnosis of SARS-CoV-2 by FDA under an Emergency Use Authorization (EUA). This EUA will remain  in effect (meaning this test can be used) for the duration of the COVID-19 declaration under Section 56 4(b)(1) of the Act, 21 U.S.C. section 360bbb-3(b)(1), unless the authorization is terminated or revoked sooner. Performed at [facility], [address] [place], [place] [address]   Culture, blood (routine x 2)     Status: None (Preliminary result)   Collection Time: 04/22/19  1:22 AM   Specimen: BLOOD LEFT FOREARM  Result Value Ref Range Status   Specimen Description BLOOD LEFT FOREARM  Final   Special Requests   Final    BOTTLES DRAWN AEROBIC AND ANAEROBIC Blood Culture adequate volume   Culture   Final    NO GROWTH 1 DAY Performed at [facility], [address], [place], [place] [address]    Report Status PENDING  Incomplete  Culture, blood (routine x 2)     Status: None (Preliminary result)   Collection Time: 04/22/19  1:32 AM   Specimen: BLOOD RIGHT FOREARM  Result Value Ref Range Status   Specimen Description BLOOD RIGHT FOREARM  Final   Special Requests    Final    AEROBIC BOTTLE ONLY Blood Culture results may not be optimal due to an inadequate volume of blood received in culture bottles   Culture   Final    NO GROWTH 1 DAY Performed at [facility], [address] [place], [place] [address]    Report Status PENDING  Incomplete      Studies: ECHOCARDIOGRAM COMPLETE  Result Date: 04/23/2019   ECHOCARDIOGRAM REPORT   Patient Name:   Vernon [facility] Sr. Date of Exam: 04/23/2019 Medical Rec #:  [id number]         Height:       68.0 in Accession #:    [id number]  Weight:       147.0 lb Date of Birth:  [birth year]         BSA:          1.79 m Patient Age:    89 years          BP:           102/46 mmHg Patient Gender: M                 HR:           95 bpm. Exam Location:  Inpatient Procedure: 2D Echo, Cardiac Doppler and Color Doppler Indications:    Cardiomegaly  History:        Patient has prior history of Echocardiogram examinations, most                 recent 09/20/2016. Signs/Symptoms:Hypotension and Altered Mental                 Status; Risk Factors:Hypertension and Dyslipidemia. UTI, LE                 edema.  Sonographer:    Lavenia Atlas Referring Phys: [phone] RONDELL A SMITH IMPRESSIONS  1. Left ventricular ejection fraction, by visual estimation, is 55 to 60%. The left ventricle has normal function. There is mildly increased left ventricular hypertrophy.  2. Left ventricular diastolic parameters are consistent with Grade I diastolic dysfunction (impaired relaxation).  3. The left ventricle has no regional wall motion abnormalities.  4. Global right ventricle was not well visualized.The right ventricular size is not well visualized. Right vetricular wall thickness was not assessed.  5. Left atrial size was normal.  6. Right atrial size was normal.  7. Moderate thickening of the mitral valve leaflet(s).  8. The mitral valve is abnormal. Mild mitral valve regurgitation.  9. The tricuspid valve is normal in structure. 10. Aortic valve  area, by VTI measures 1.42 cm. 11. Aortic valve mean gradient measures 8.0 mmHg. 12. Aortic valve peak gradient measures 15.0 mmHg. 13. The aortic valve is tricuspid. Aortic valve regurgitation is trivial. Mild aortic valve stenosis. 14. The pulmonic valve was grossly normal. Pulmonic valve regurgitation is not visualized. 15. Normal pulmonary artery systolic pressure. 16. The inferior vena cava is normal in size with greater than 50% respiratory variability, suggesting right atrial pressure of 3 mmHg. FINDINGS  Left Ventricle: Left ventricular ejection fraction, by visual estimation, is 55 to 60%. The left ventricle has normal function. The left ventricle has no regional wall motion abnormalities. There is mildly increased left ventricular hypertrophy. Left ventricular diastolic parameters are consistent with Grade I diastolic dysfunction (impaired relaxation). Indeterminate filling pressures. Right Ventricle: The right ventricular size is not well visualized. Right vetricular wall thickness was not assessed. Global RV systolic function is was not well visualized. The tricuspid regurgitant velocity is 2.42 m/s, and with an assumed right atrial  pressure of 3 mmHg, the estimated right ventricular systolic pressure is normal at 26.4 mmHg. Left Atrium: Left atrial size was normal in size. Right Atrium: Right atrial size was normal in size Pericardium: There is no evidence of pericardial effusion. Mitral Valve: The mitral valve is abnormal. There is moderate thickening of the mitral valve leaflet(s). Mild mitral valve regurgitation. MV peak gradient, 12.7 mmHg. Tricuspid Valve: The tricuspid valve is normal in structure. Tricuspid valve regurgitation is trivial. Aortic Valve: The aortic valve is tricuspid. Aortic valve regurgitation is trivial. Aortic regurgitation PHT measures 488 msec. Mild aortic stenosis is  present. Aortic valve mean gradient measures 8.0 mmHg. Aortic valve peak gradient measures 15.0 mmHg. Aortic  valve area, by VTI measures 1.42 cm. Pulmonic Valve: The pulmonic valve was grossly normal. Pulmonic valve regurgitation is not visualized. Pulmonic regurgitation is not visualized. Aorta: The aortic root and ascending aorta are structurally normal, with no evidence of dilitation. Venous: The inferior vena cava is normal in size with greater than 50% respiratory variability, suggesting right atrial pressure of 3 mmHg. IAS/Shunts: No atrial level shunt detected by color flow Doppler.  LEFT VENTRICLE PLAX 2D LVIDd:         3.27 cm  Diastology LVIDs:         2.35 cm  LV e' lateral:   6.64 cm/s LV PW:         1.08 cm  LV E/e' lateral: 15.5 LV IVS:        1.17 cm  LV e' medial:    9.57 cm/s LVOT diam:     2.40 cm  LV E/e' medial:  10.8 LV SV:         24 ml LV SV Index:   13.43 LVOT Area:     4.52 cm  RIGHT VENTRICLE RV Basal diam:  2.50 cm RV S prime:     5.44 cm/s TAPSE (M-mode): 1.7 cm LEFT ATRIUM             Index       RIGHT ATRIUM           Index LA diam:        3.30 cm 1.84 cm/m  RA Area:     18.90 cm LA Vol (A2C):   24.5 ml 13.66 ml/m RA Volume:   57.60 ml  32.12 ml/m LA Vol (A4C):   45.1 ml 25.15 ml/m LA Biplane Vol: 35.3 ml 19.68 ml/m  AORTIC VALVE AV Area (Vmax):    1.60 cm AV Area (Vmean):   1.47 cm AV Area (VTI):     1.42 cm AV Vmax:           193.67 cm/s AV Vmean:          135.000 cm/s AV VTI:            0.375 m AV Peak Grad:      15.0 mmHg AV Mean Grad:      8.0 mmHg LVOT Vmax:         68.30 cm/s LVOT Vmean:        43.900 cm/s LVOT VTI:          0.118 m LVOT/AV VTI ratio: 0.31 AI PHT:            488 msec  AORTA Ao Root diam: 3.40 cm MITRAL VALVE                         TRICUSPID VALVE MV Area (PHT): 4.31 cm              TR Peak grad:   23.4 mmHg MV Peak grad:  12.7 mmHg             TR Vmax:        242.00 cm/s MV Mean grad:  4.0 mmHg MV Vmax:       1.78 m/s              SHUNTS MV Vmean:      92.0 cm/s  Systemic VTI:  0.12 m MV VTI:        0.44 m                Systemic Diam: 2.40 cm MV  PHT:        51.04 msec MV Decel Time: 176 msec MV E velocity: 103.00 cm/s 103 cm/s MV A velocity: 169.00 cm/s 70.3 cm/s MV E/A ratio:  0.61        1.5  Zoila Shutter MD Electronically signed by Zoila Shutter MD Signature Date/Time: 04/23/2019/10:47:49 AM    Final     Scheduled Meds: . Melene Muller ON 04/24/2019] enoxaparin (LOVENOX) injection  40 mg Subcutaneous Q24H  . feeding supplement (ENSURE ENLIVE)  237 mL Oral BID BM  . sodium chloride flush  3 mL Intravenous Q12H    Continuous Infusions: . ceFEPime (MAXIPIME) IV 2 g (04/23/19 0945)  . vancomycin 750 mg (04/23/19 1054)     LOS: 1 day     Hollice Espy, MD [facility]  To reach me or the doctor on call, go to: [email or web address] Password [email or web address]  04/23/2019, 1:57 PM

## 2019-04-23 NOTE — Evaluation (Signed)
Physical Therapy Evaluation Patient Details Name: Vernon Walton. 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 PMHx; AKI, spinal stenosis, HLD, syncope, HTN.  Clinical Impression  Pt admitted with above diagnosis.  Pt currently with functional limitations due to the deficits listed below (see PT Problem List). Pt will benefit from skilled PT to increase their independence and safety with mobility to allow discharge to the venue listed below. An air mattress had been ordered, but had not yet arrived at time of PT eval.  Pt lives alone and at this time requires +2 for bed mobility.  Recommend SNF at this time.     Follow Up Recommendations SNF    Equipment Recommendations  None recommended by PT    Recommendations for Other Services       Precautions / Restrictions Precautions Precautions: Fall;Other (comment) Precaution Comments: pressure sores on bottom and lower spine Restrictions Weight Bearing Restrictions: No      Mobility  Bed Mobility Overal bed mobility: Needs Assistance Bed Mobility: Rolling;Sidelying to Sit;Supine to Sit Rolling: Max assist;+2 for physical assistance Sidelying to sit: Max assist;+2 for physical assistance Supine to sit: Total assist;+2 for physical assistance;+2 for safety/equipment     General bed mobility comments: Pt able to use handrails for rolling to R side, but unable to hold position for very long. Sit to supine, pt unable to assist.  Transfers                 General transfer comment: deferred as pt unable to sit upright on his own. Pt stating no, I don't want to get up right now."  Ambulation/Gait                Stairs            Wheelchair Mobility    Modified Rankin (Stroke Patients Only)       Balance Overall balance assessment: Needs  assistance Sitting-balance support: Bilateral upper extremity supported;Feet supported Sitting balance-Leahy Scale: Poor Sitting balance - Comments: leaning on R elbow stated "comfortable this way." Unable to maintain sitting upright.                                     Pertinent Vitals/Pain Pain Assessment: Faces Faces Pain Scale: Hurts little more Pain Location: low back and bottom with movement Pain Descriptors / Indicators: Discomfort;Sore Pain Intervention(s): Limited activity within patient's tolerance;Monitored during session;Repositioned    Home Living Family/patient expects to be discharged to:: Private residence Living Arrangements: Alone Available Help at Discharge: Friend(s);Available PRN/intermittently Type of Home: House Home Access: Stairs to enter Entrance Stairs-Rails: Can reach both Entrance Stairs-Number of Steps: 4 Home Layout: Two level;Able to live on main level with bedroom/bathroom Home Equipment: Walker - 2 wheels      Prior Function Level of Independence: Needs assistance   Gait / Transfers Assistance Needed: :Pt reports inability to walk safely lately. :Pt reports RW use.  ADL's / Homemaking Assistance Needed: Pt came in with sores on his bottom and spine; pt reports that he was caring for himself, but it was getting harder        Hand Dominance   Dominant Hand: Right    Extremity/Trunk Assessment   Upper Extremity Assessment Upper Extremity Assessment: Defer  to OT evaluation    Lower Extremity Assessment Lower Extremity Assessment: Generalized weakness    Cervical / Trunk Assessment Cervical / Trunk Assessment: Kyphotic  Communication   Communication: HOH  Cognition Arousal/Alertness: Awake/alert Behavior During Therapy: WFL for tasks assessed/performed Overall Cognitive Status: No family/caregiver present to determine baseline cognitive functioning                                 General Comments: Pt  appears to have a lack of insight into current deficits and poor safety awareness. Pt unable to see that caring for himself is too difficult.      General Comments General comments (skin integrity, edema, etc.): RN requiring assist to roll pt to replace pads to place over sores.    Exercises     Assessment/Plan    PT Assessment Patient needs continued PT services  PT Problem List Decreased strength;Decreased activity tolerance;Decreased balance;Decreased mobility;Decreased skin integrity;Decreased safety awareness       PT Treatment Interventions Functional mobility training;Therapeutic activities;Therapeutic exercise;Balance training;Patient/family education    PT Goals (Current goals can be found in the Care Plan section)  Acute Rehab PT Goals Patient Stated Goal: to feel better PT Goal Formulation: With patient Time For Goal Achievement: 05/07/19 Potential to Achieve Goals: Fair    Frequency Min 2X/week   Barriers to discharge Decreased caregiver support      Co-evaluation PT/OT/SLP Co-Evaluation/Treatment: Yes Reason for Co-Treatment: For patient/therapist safety PT goals addressed during session: Mobility/safety with mobility         AM-PAC PT "6 Clicks" Mobility  Outcome Measure Help needed turning from your back to your side while in a flat bed without using bedrails?: A Lot Help needed moving from lying on your back to sitting on the side of a flat bed without using bedrails?: A Lot Help needed moving to and from a bed to a chair (including a wheelchair)?: Total Help needed standing up from a chair using your arms (e.g., wheelchair or bedside chair)?: Total Help needed to walk in hospital room?: Total Help needed climbing 3-5 steps with a railing? : Total 6 Click Score: 8    End of Session   Activity Tolerance: Patient limited by fatigue Patient left: in bed;with call bell/phone within reach;with bed alarm set Nurse Communication: Mobility status PT Visit  Diagnosis: Other abnormalities of gait and mobility (R26.89);Muscle weakness (generalized) (M62.81)    Time: 2334-3568 PT Time Calculation (min) (ACUTE ONLY): 26 min   Charges:   PT Evaluation $PT Eval Moderate Complexity: 1 Mod          Vernon Walton,  Pager [phone] 04/23/2019   Vernon Walton 04/23/2019, 1:29 PM

## 2019-04-23 NOTE — NC FL2 (Signed)
[place] MEDICAID FL2 LEVEL OF CARE SCREENING TOOL     IDENTIFICATION  Patient Name: Vernon Walton. Birthdate: February 04, 1919 Sex: male Admission Date (Current Location): 04/21/2019  [facility] and [place] Number:  Producer, television/film/video and Address:  [facility] [place], [address] [place], [place] [address]      Provider Number: [id number]  Attending Physician Name and Address:  Hollice Espy, MD  Relative Name and Phone Number:  Kenzie Flakes [phone]    Current Level of Care: Hospital Recommended Level of Care: Skilled Nursing Facility Prior Approval Number:    Date Approved/Denied:   PASRR Number: [id number]  Discharge Plan: SNF    Current Diagnoses: Patient Active Problem List   Diagnosis Date Noted  . Sacral decubitus ulcer, stage II (HCC) 04/23/2019  . Protein calorie malnutrition (HCC) 04/23/2019  . Pressure injury of skin 04/22/2019  . Lower extremity weakness 04/22/2019  . Sepsis (HCC) 04/22/2019  . AKI (acute kidney injury) (HCC) 04/22/2019  . Spinal stenosis 04/22/2019  . Degenerative disc disease, lumbar 04/22/2019  . Hypoalbuminemia 04/22/2019  . Syncope 09/01/2016  . Symptomatic bradycardia 09/01/2016  . Hypertension 09/01/2016  . HLD (hyperlipidemia) 09/01/2016  . Anemia 09/01/2016    Orientation RESPIRATION BLADDER Height & Weight     Self, Time, Situation, Place  Normal Continent Weight: 66.7 kg Height:     BEHAVIORAL SYMPTOMS/MOOD NEUROLOGICAL BOWEL NUTRITION STATUS      Incontinent(episodes of bowel incontinence) Diet  AMBULATORY STATUS COMMUNICATION OF NEEDS Skin   Extensive Assist Verbally Other (Comment)(stage 1 to upper thoracic area cover with silicone foam, Stage 2 sacral area partial thickness cover silicone foam , low air loss mattress)                       Personal Care Assistance Level of Assistance  Bathing, Dressing, Feeding Bathing Assistance: Maximum assistance Feeding assistance:  Limited assistance Dressing Assistance: Maximum assistance     Functional Limitations Info  Hearing   Hearing Info: Impaired(hard of hearing)      SPECIAL CARE FACTORS FREQUENCY  PT (By licensed PT), OT (By licensed OT)     PT Frequency: 5 times a week OT Frequency: 5 times a week            Contractures Contractures Info: Not present    Additional Factors Info  Code Status, Allergies Code Status Info: full code Allergies Info: no known allergies           Current Medications (04/23/2019):  This is the current hospital active medication list Current Facility-Administered Medications  Medication Dose Route Frequency Provider Last Rate Last Admin  . acetaminophen (TYLENOL) tablet 650 mg  650 mg Oral Q6H PRN Clydie Braun, MD       Or  . acetaminophen (TYLENOL) suppository 650 mg  650 mg Rectal Q6H PRN Smith, Rondell A, MD      . albuterol (PROVENTIL) (2.5 MG/3ML) 0.083% nebulizer solution 2.5 mg  2.5 mg Nebulization Q6H PRN Smith, Rondell A, MD      . ceFEPIme (MAXIPIME) 2 g in sodium chloride 0.9 % 100 mL IVPB  2 g Intravenous Q12H Almon Hercules, RPH 200 mL/hr at 04/23/19 0945 2 g at 04/23/19 0945  . [START ON 04/24/2019] enoxaparin (LOVENOX) injection 40 mg  40 mg Subcutaneous Q24H Daylene Posey, RPH      . feeding supplement (ENSURE ENLIVE) (ENSURE ENLIVE) liquid 237 mL  237  mL Oral BID BM Annita Brod, MD      . ondansetron [facility]) tablet 4 mg  4 mg Oral Q6H PRN Fuller Plan A, MD       Or  . ondansetron (ZOFRAN) injection 4 mg  4 mg Intravenous Q6H PRN Smith, Rondell A, MD      . sodium chloride flush (NS) 0.9 % injection 3 mL  3 mL Intravenous Q12H Smith, Rondell A, MD   3 mL at 04/23/19 0950  . vancomycin (VANCOREADY) IVPB 750 mg/150 mL  750 mg Intravenous Q24H Romona Curls, RPH 150 mL/hr at 04/23/19 1054 750 mg at 04/23/19 1054     Discharge Medications: Please see discharge summary for a list of discharge medications.  Relevant Imaging  Results:  Relevant Lab Results:   Additional Information [facility] [address] [place], Edson Snowball, RN

## 2019-04-23 NOTE — TOC Initial Note (Signed)
Transition of Care [facility]) - Initial/Assessment Note    Patient Details  Name: Vernon Walton. MRN: [id number] Date of Birth: May 25, 1918  [facility]) CM/SW Contact:    Marilu Favre, RN Phone Number: 04/23/2019, 3:04 PM  Clinical Narrative:                  Patient from home alone, has a friend who checks in on him. PT recommendations SNF for short term rehab, patient and son both in agreement. Will fax FL2 and once have offers will provide them to patient and son. Son is 2 hours behind [phone]. Son will start looking on Medicare.gov website for SNF ratings.  Expected Discharge Plan: [place]     Patient Goals and CMS Choice Patient states their goals for this hospitalization and ongoing recovery are:: to get stronger CMS Medicare.gov Compare Post Acute Care list provided to:: Patient Choice offered to / list presented to : Patient, Adult Children  Expected Discharge Plan and Services Expected Discharge Plan: [place]   Discharge Planning Services: CM Consult   Living arrangements for the past 2 months: Single Family Home                 DME Arranged: N/A         HH Arranged: NA          Prior Living Arrangements/Services Living arrangements for the past 2 months: Single Family Home Lives with:: Self Patient language and need for interpreter reviewed:: Yes Do you feel safe going back to the place where you live?: Yes      Need for Family Participation in Patient Care: Yes (Comment) Care giver support system in place?: No (comment)   Criminal Activity/Legal Involvement Pertinent to Current Situation/Hospitalization: No - Comment as needed  Activities of Daily Living      Permission Sought/Granted   Permission granted to share information with : Yes, Verbal Permission Granted  Share Information with NAME: Sandy Blouch [address] granted to share info w Relationship: son      Emotional Assessment Appearance:: Appears stated age Attitude/Demeanor/Rapport: Engaged Affect (typically observed): Accepting Orientation: : Oriented to Self, Oriented to Place, Oriented to  Time, Oriented to Situation Alcohol / Substance Use: Not Applicable Psych Involvement: No (comment)  Admission diagnosis:  Weakness [R53.1] AKI (acute kidney injury) ([place]) [N17.9] Hypotension [I95.9] Sepsis ([place]) [A41.9] Weakness of both lower extremities [R29.898] Patient Active Problem List   Diagnosis Date Noted  . Sacral decubitus ulcer, stage II ([place]) 04/23/2019  . Protein calorie malnutrition ([place]) 04/23/2019  . Pressure injury of skin 04/22/2019  . Lower extremity weakness 04/22/2019  . Sepsis ([place]) 04/22/2019  . AKI (acute kidney injury) ([place]) 04/22/2019  . Spinal stenosis 04/22/2019  . Degenerative disc disease, lumbar 04/22/2019  . Hypoalbuminemia 04/22/2019  . Syncope 09/01/2016  . Symptomatic bradycardia 09/01/2016  . Hypertension 09/01/2016  . HLD (hyperlipidemia) 09/01/2016  . Anemia 09/01/2016   PCP:  Deland Pretty, MD Pharmacy:   [id number] - [place], [place] [address] Phone: [phone] Fax: [phone]     Social Determinants of Health (SDOH) Interventions    Readmission Risk Interventions No flowsheet data found.

## 2019-04-23 NOTE — Evaluation (Signed)
Occupational Therapy Evaluation Patient Details Name: Vernon Walton. MRN: [id number] DOB: [birth year] Today's Date: 04/23/2019    History of Present Illness pt is a 85 yo male s/p BLE weakness, urinary retention, Lumbar degenerative chnages that given his age are non operable. PMHx; AKI, spinal stenosis, HLD, syncope, HTN.   Clinical Impression   Pt PTA: Pt living at home alone, unable to properly care for self as seen on wound care of coccyx and spine. Pt reports that he had not been walking for 4-5 days prior to admission. Pt has a friend that brings him food, but does not physically assist. Pt currently appears to have a lack of insight into current deficits and poor safety awareness.  Pt limited by poor activity tolerance, poor strength and poor ability to care for self. Pt performing grooming tasks bed level with minA overall; maxA to totalA for all other ADL. Pt maxA +2 to totalA +2 for bed mobility with R lateral lean at EOB. Pt requires continued OT skilled services for ADL and mobility. OT following acutely.    Follow Up Recommendations  SNF;Supervision/Assistance - 24 hour    Equipment Recommendations  Other (comment)(to be determined)    Recommendations for Other Services       Precautions / Restrictions Precautions Precautions: Fall;Other (comment) Precaution Comments: pressure sores on bottom and lower spine Restrictions Weight Bearing Restrictions: No      Mobility Bed Mobility Overal bed mobility: Needs Assistance Bed Mobility: Rolling;Sidelying to Sit;Supine to Sit Rolling: Max assist;+2 for physical assistance Sidelying to sit: Max assist;+2 for physical assistance Supine to sit: Total assist;+2 for physical assistance;+2 for safety/equipment     General bed mobility comments: Pt able to use handrails for rolling to R side, but unable to hold position for very long. Sit to supine, pt unable to assist.  Transfers                 General transfer  comment: deferred as pt unable to sit upright on his own. Pt stating no, I don't want to get up right now."    Balance Overall balance assessment: Needs assistance Sitting-balance support: Bilateral upper extremity supported;Feet supported Sitting balance-Leahy Scale: Poor Sitting balance - Comments: leaning on R elbow stated "comfortable this way." Unable to maintain sitting upright.                                   ADL either performed or assessed with clinical judgement   ADL Overall ADL's : Needs assistance/impaired Eating/Feeding: Set up;Sitting   Grooming: Set up;Bed level   Upper Body Bathing: Moderate assistance;Bed level;Cueing for safety   Lower Body Bathing: Maximal assistance;Total assistance;+2 for physical assistance;+2 for safety/equipment;Sitting/lateral leans;Bed level   Upper Body Dressing : Moderate assistance;Bed level   Lower Body Dressing: Maximal assistance;Total assistance;+2 for physical assistance;+2 for safety/equipment;Cueing for safety;Sitting/lateral leans;Bed level   Toilet Transfer: Total assistance;+2 for physical assistance;+2 for safety/equipment   Toileting- Clothing Manipulation and Hygiene: Maximal assistance;Total assistance;+2 for physical assistance;+2 for safety/equipment;Sitting/lateral lean;Cueing for safety;Bed level       Functional mobility during ADLs: Maximal assistance;Total assistance;+2 for physical assistance;+2 for safety/equipment General ADL Comments: pt limited by poor activity tolerance, poor strength and poor ability to care for self. Pt performing grooming tasks bed level.     Vision Baseline Vision/History: No visual deficits Vision Assessment?: No apparent visual deficits     Perception  Praxis      Pertinent Vitals/Pain Pain Assessment: Faces Faces Pain Scale: Hurts little more Pain Location: low back and bottom with movement Pain Descriptors / Indicators: Discomfort;Sore Pain  Intervention(s): Monitored during session;Limited activity within patient's tolerance;Repositioned     Hand Dominance Right   Extremity/Trunk Assessment Upper Extremity Assessment Upper Extremity Assessment: Generalized weakness   Lower Extremity Assessment Lower Extremity Assessment: Generalized weakness;Defer to PT evaluation   Cervical / Trunk Assessment Cervical / Trunk Assessment: Kyphotic   Communication Communication Communication: HOH   Cognition Arousal/Alertness: Awake/alert Behavior During Therapy: WFL for tasks assessed/performed Overall Cognitive Status: No family/caregiver present to determine baseline cognitive functioning                                 General Comments: Pt appears to have a lack of insight into current deficits and poor safety awareness. Pt unable to see that caring for himself is too difficult.   General Comments  RN requiring assist to roll pt to replace pads to place over sores.    Exercises     Shoulder Instructions      Home Living Family/patient expects to be discharged to:: Private residence Living Arrangements: Alone Available Help at Discharge: Friend(s);Available PRN/intermittently Type of Home: House Home Access: Stairs to enter Entergy CorporationEntrance Stairs-Number of Steps: 4 Entrance Stairs-Rails: Can reach both Home Layout: Two level;Able to live on main level with bedroom/bathroom     Bathroom Shower/Tub: Walk-in shower   Bathroom Toilet: Handicapped height     Home Equipment: Environmental consultantWalker - 2 wheels          Prior Functioning/Environment Level of Independence: Needs assistance  Gait / Transfers Assistance Needed: :Pt reports inability to walk safely lately. :Pt reports RW use. ADL's / Homemaking Assistance Needed: Pt came in with sores on his bottom and spine; pt reports that he was caring for himself, but it was getting harder            OT Problem List: Decreased strength;Decreased activity tolerance;Impaired  balance (sitting and/or standing);Decreased safety awareness;Pain;Decreased knowledge of use of DME or AE;Increased edema;Impaired UE functional use;Decreased range of motion;Decreased cognition      OT Treatment/Interventions: Self-care/ADL training;Therapeutic exercise;Energy conservation;DME and/or AE instruction;Therapeutic activities;Visual/perceptual remediation/compensation;Patient/family education;Balance training    OT Goals(Current goals can be found in the care plan section) Acute Rehab OT Goals Patient Stated Goal: to feel better OT Goal Formulation: With patient Time For Goal Achievement: 05/07/19 Potential to Achieve Goals: Good ADL Goals Pt Will Perform Grooming: with supervision;sitting Pt Will Perform Lower Body Dressing: with min assist;sitting/lateral leans;sit to/from stand Pt Will Transfer to Toilet: squat pivot transfer;bedside commode;with mod assist Pt/caregiver will Perform Home Exercise Program: Increased strength;Both right and left upper extremity;With Supervision Additional ADL Goal #1: Pt will state 3 energy conservation strategies to utilize.  OT Frequency: Min 2X/week   Barriers to D/C: Decreased caregiver support  pt lives alone       Co-evaluation              AM-PAC OT "6 Clicks" Daily Activity     Outcome Measure Help from another person eating meals?: A Little Help from another person taking care of personal grooming?: A Lot Help from another person toileting, which includes using toliet, bedpan, or urinal?: Total Help from another person bathing (including washing, rinsing, drying)?: Total Help from another person to put on and taking off regular upper body clothing?: A  Lot Help from another person to put on and taking off regular lower body clothing?: Total 6 Click Score: 10   End of Session Equipment Utilized During Treatment: Gait belt Nurse Communication: Mobility status  Activity Tolerance: Patient limited by pain Patient left: in  bed;with call bell/phone within reach;with bed alarm set  OT Visit Diagnosis: Unsteadiness on feet (R26.81);Muscle weakness (generalized) (M62.81);Repeated falls (R29.6);Pain Pain - part of body: (back and bottom)                Time: 9390-3009 OT Time Calculation (min): 39 min Charges:  OT General Charges $OT Visit: 1 Visit OT Evaluation $OT Eval Moderate Complexity: 1 Mod OT Treatments $Self Care/Home Management : 8-22 mins  Ebony Hail Harold Hedge) Marsa Aris OTR/L Acute Rehabilitation Services Pager: [phone] Office: [place] 04/23/2019, 11:18 AM

## 2019-04-23 NOTE — Progress Notes (Signed)
Called [facility] for wound air mattress per Byron stated they do not have any available at the moment  Will check again later  Redistribution chair cushion at bedside, pt unable to move to the chair at this time, PT at bedside  Will continue to monitor

## 2019-04-23 NOTE — Progress Notes (Signed)
*  PRELIMINARY RESULTS* Echocardiogram 2D Echocardiogram has been performed.  Vernon Walton 04/23/2019, 9:12 AM

## 2019-04-23 NOTE — Progress Notes (Signed)
Pt concerned that valsartan is not resumed  Educated pt on low blood pressure and risk  Pt still expresses concern  Informed MD  Will continue to monitor

## 2019-04-23 NOTE — Care Management (Signed)
Consult for home health vs SNF. Await PT/OT evaluations.  Magdalen Spatz RN

## 2019-04-24 LAB — COMPREHENSIVE METABOLIC PANEL
ALT: 18 U/L (ref 0–44)
AST: 20 U/L (ref 15–41)
Albumin: 2 g/dL — ABNORMAL LOW (ref 3.5–5.0)
Alkaline Phosphatase: 54 U/L (ref 38–126)
Anion gap: 9 (ref 5–15)
BUN: 31 mg/dL — ABNORMAL HIGH (ref 8–23)
CO2: 23 mmol/L (ref 22–32)
Calcium: 8.4 mg/dL — ABNORMAL LOW (ref 8.9–10.3)
Chloride: 106 mmol/L (ref 98–111)
Creatinine, Ser: 0.86 mg/dL (ref 0.61–1.24)
GFR calc Af Amer: >60 mL/min (ref >60)
GFR calc non Af Amer: >60 mL/min (ref >60)
Glucose, Bld: 113 mg/dL — ABNORMAL HIGH (ref 70–99)
Potassium: 3.6 mmol/L (ref 3.5–5.1)
Sodium: 138 mmol/L (ref 135–145)
Total Bilirubin: 0.8 mg/dL (ref 0.3–1.2)
Total Protein: 5.3 g/dL — ABNORMAL LOW (ref 6.5–8.1)

## 2019-04-24 LAB — CBC
HCT: 35.7 % — ABNORMAL LOW (ref 39.0–52.0)
Hemoglobin: 11.8 g/dL — ABNORMAL LOW (ref 13.0–17.0)
MCH: 33.1 pg (ref 26.0–34.0)
MCHC: 33.1 g/dL (ref 30.0–36.0)
MCV: 100.3 fL — ABNORMAL HIGH (ref 80.0–100.0)
Platelets: 245 10*3/uL (ref 150–400)
RBC: 3.56 MIL/uL — ABNORMAL LOW (ref 4.22–5.81)
RDW: 12.3 % (ref 11.5–15.5)
WBC: 10.7 10*3/uL — ABNORMAL HIGH (ref 4.0–10.5)
nRBC: 0 % (ref 0.0–0.2)

## 2019-04-24 LAB — BRAIN NATRIURETIC PEPTIDE: B Natriuretic Peptide: 90.2 pg/mL (ref 0.0–100.0)

## 2019-04-24 NOTE — TOC Progression Note (Signed)
Transition of Care [facility]) - Progression Note    Patient Details  Name: Vernon Walton. MRN: [id number] Date of Birth: 1918/07/21  Transition of Care [facility]) CM/SW Contact  Doy Hutching, [place] Phone Number: 04/24/2019, 1:28 PM  Clinical Narrative:    CSW spoke with pt son via telephone at [phone]. Pt son amenable to offers being emailed to him at [email or web address]; due to holiday limited offers at the current time. Will email current choices. Have f/u with multiple pending SNFs, await return messages.    Expected Discharge Plan: Skilled Nursing Facility  Expected Discharge Plan and Services Expected Discharge Plan: Skilled Nursing Facility Discharge Planning Services: CM Consult Living arrangements for the past 2 months: Single Family Home            DME Arranged: N/A HH Arranged: NA    Readmission Risk Interventions No flowsheet data found.

## 2019-04-24 NOTE — Progress Notes (Signed)
Patient ID: Vernon Kyle MEO Sr., male   DOB: [birth year], 86 y.o.   MRN: [id number]  PROGRESS NOTE    Vernon CORSETTI MEO Sr.  HYW:[id number] DOB: Oct 22, 1918 DOA: 04/21/2019 PCP: Deland Pretty, MD   Brief Narrative:  86 year old male with history of hypertension, hyperlipidemia and arthritis, recent urinary retention for which Foley catheter was placed in the ED on 04/15/2019 presented with low blood pressure.  On presentation to the ED, he had WBCs of 78.6; Covid antigen test was negative.  Suspicion was negative for infiltrates.  MRI of the thoracic and lumbar spine showed remote T5 compression fracture that was healed and severe degenerative disc disease with kyphoscoliosis, foraminal stenosis and areas of impingement.  Patient was started on IV fluids and antibiotics.  Neurosurgery was consulted and recommended conservative management.  PT recommended SNF placement.  Assessment & Plan:   Question of sepsis: Sepsis has been ruled out -Presented with leukocytosis with white count of 17.6 and lactic acid of 2.7.  Chest x-ray was negative for infiltrates.  Urinalysis was negative.  COVID-19 testing was negative.  Started on broad-spectrum antibiotics vancomycin and cefepime along with IV fluids.  IV fluids have been subsequently discontinued. -Cultures have been negative so far -There was a concern for decubitus ulcer infection but after wound care nurse evaluation, doubt that patient has decubitus ulcer infection.  Will DC vancomycin today.  We will continue cefepime for 1 more day and if there is no signs of infection, will DC antibiotics altogether and monitor.  Leukocytosis -Resolved  Hypotension -Blood pressure still on the lower side.  Will hold off on starting antihypertensives.  Bilateral lower extremity weakness -Patient has been unable to ambulate due to weakness.  MRI of the thoracic and lumbar spine showed remote T5 compression fracture that was healed and severe degenerative disc disease  with kyphoscoliosis, foraminal stenosis and areas of impingement - Neurosurgery was consulted and recommended conservative management.   -PT recommended SNF placement.  Social worker following.  Acute kidney injury -Presented with creatinine of 1.51.  Baseline creatinine of 0.7-0.8.  Treated with IV fluids.  Creatinine back to baseline.  Acute urinary retention -Status post Foley catheter placement on 04/15/2019 in the ED.  Will give a voiding trial.  If goes back into retention, will have to Place Foley catheter again and might need outpatient urology evaluation.  Stage I pressure injury to upper thoracic area: Present on admission Stage II pressure injuries, 3 in total in the sacral area: Present on admission -Wound care evaluation appreciated.  Wound care as per wound care evaluation and recommendations.  Generalized deconditioning -Spoke to patient today and he agrees for changing CODE STATUS to DNR.  Will request palliative care consultation for goals of care discussion   DVT prophylaxis: Lovenox Code Status: DNR Family Communication: Spoke to son/Rami on phone on April 24, 2019 Disposition Plan: SNF once bed is available  Consultants: Neurosurgery  Procedures: None  Antimicrobials:  Anti-infectives (From admission, onward)   Start     Dose/Rate Route Frequency Ordered Stop   04/23/19 1000  vancomycin (VANCOREADY) IVPB 750 mg/150 mL     750 mg 150 mL/hr over 60 Minutes Intravenous Every 24 hours 04/22/19 1444     04/23/19 0900  ceFEPIme (MAXIPIME) 2 g in sodium chloride 0.9 % 100 mL IVPB  Status:  Discontinued     2 g 200 mL/hr over 30 Minutes Intravenous Every 24 hours 04/22/19 1158 04/22/19 1446   04/23/19 0700  ceFEPIme (MAXIPIME) 1  g in sodium chloride 0.9 % 100 mL IVPB  Status:  Discontinued     1 g 200 mL/hr over 30 Minutes Intravenous Every 24 hours 04/22/19 0655 04/22/19 1158   04/22/19 2100  ceFEPIme (MAXIPIME) 2 g in sodium chloride 0.9 % 100 mL IVPB     2  g 200 mL/hr over 30 Minutes Intravenous Every 12 hours 04/22/19 1446     04/22/19 0700  vancomycin (VANCOREADY) IVPB 1250 mg/250 mL     1,250 mg 166.7 mL/hr over 90 Minutes Intravenous  Once 04/22/19 0648 04/22/19 1147   04/22/19 0700  ceFEPIme (MAXIPIME) 2 g in sodium chloride 0.9 % 100 mL IVPB     2 g 200 mL/hr over 30 Minutes Intravenous  Once 04/22/19 0648 04/22/19 0919   04/22/19 0655  vancomycin variable dose per unstable renal function (pharmacist dosing)  Status:  Discontinued      Does not apply See admin instructions 04/22/19 0655 04/22/19 1444       Subjective: Patient seen and examined at bedside.  He is awake and feels weak and tired.  No overnight fever or vomiting reported.  Objective: Vitals:   04/23/19 1753 04/23/19 1834 04/24/19 0013 04/24/19 0553  BP: (!) 97/52  (!) 98/53 (!) 95/40  Pulse: (!) 55  89 84  Resp: 16  18 18   Temp: 98.4 F (36.9 C)  98.6 F (37 C) 98.4 F (36.9 C)  TempSrc: Oral  Oral Oral  SpO2: 99%  97% 99%  Weight:    65 kg  Height:  5\' 8"  (1.727 m)      Intake/Output Summary (Last 24 hours) at 04/24/2019 0948 Last data filed at 04/24/2019 0600 Gross per 24 hour  Intake 1070 ml  Output 1200 ml  Net -130 ml   Filed Weights   04/23/19 0500 04/23/19 0549 04/24/19 0553  Weight: 65.5 kg 66.7 kg 65 kg    Examination:  General exam: Appears calm and comfortable.  Elderly male lying in bed.  Poor historian. Respiratory system: Bilateral decreased breath sounds at bases Cardiovascular system: S1 & S2 heard, Rate controlled Gastrointestinal system: Abdomen is nondistended, soft and nontender. Normal bowel sounds heard. Extremities: No cyanosis, clubbing; trace lower extremity edema  Data Reviewed: I have personally reviewed following labs and imaging studies  CBC: Recent Labs  Lab 04/21/19 1722 04/22/19 1300 04/23/19 0516 04/24/19 0142  WBC 17.6* 16.1* 11.7* 10.7*  HGB 13.0 13.1 11.4* 11.8*  HCT 40.6 40.3 34.9* 35.7*  MCV 103.0*  102.0* 100.3* 100.3*  PLT 226 231 219 245   Basic Metabolic Panel: Recent Labs  Lab 04/21/19 1722 04/22/19 1300 04/23/19 0516 04/24/19 0142  NA 138 136 136 138  K 4.7 3.9 3.7 3.6  CL 100 103 105 106  CO2 24 19* 21* 23  GLUCOSE 105* 115* 117* 113*  BUN 53* 49* 37* 31*  CREATININE 1.51* 1.13 0.94 0.86  CALCIUM 8.4* 8.5* 8.1* 8.4*   GFR: Estimated Creatinine Clearance: 44.1 mL/min (by C-G formula based on SCr of 0.86 mg/dL). Liver Function Tests: Recent Labs  Lab 04/21/19 1722 04/24/19 0142  AST 29 20  ALT 19 18  ALKPHOS 53 54  BILITOT 1.1 0.8  PROT 6.4* 5.3*  ALBUMIN 2.6* 2.0*   No results for input(s): LIPASE, AMYLASE in the last 168 hours. No results for input(s): AMMONIA in the last 168 hours. Coagulation Profile: No results for input(s): INR, PROTIME in the last 168 hours. Cardiac Enzymes: Recent Labs  Lab 04/22/19 1755  CKTOTAL 295   BNP (last 3 results) No results for input(s): PROBNP in the last 8760 hours. HbA1C: No results for input(s): HGBA1C in the last 72 hours. CBG: No results for input(s): GLUCAP in the last 168 hours. Lipid Profile: No results for input(s): CHOL, HDL, LDLCALC, TRIG, CHOLHDL, LDLDIRECT in the last 72 hours. Thyroid Function Tests: No results for input(s): TSH, T4TOTAL, FREET4, T3FREE, THYROIDAB in the last 72 hours. Anemia Panel: No results for input(s): VITAMINB12, FOLATE, FERRITIN, TIBC, IRON, RETICCTPCT in the last 72 hours. Sepsis Labs: Recent Labs  Lab 04/21/19 2229 04/22/19 1300 04/22/19 1956  LATICACIDVEN 2.7* 2.7* 1.6    Recent Results (from the past 240 hour(s))  Urine culture     Status: None   Collection Time: 04/15/19  1:42 PM   Specimen: Urine, Clean Catch  Result Value Ref Range Status   Specimen Description   Final    URINE, CLEAN CATCH Performed at [facility], [address], [place], [place] [address]    Special Requests   Final    NONE Performed at [facility], [address] [place], [place] [address]    Culture   Final    NO GROWTH Performed at [facility], [address] [place], [place] [address]    Report Status 04/16/2019 FINAL  Final  Blood Culture x 1     Status: None (Preliminary result)   Collection Time: 04/21/19  5:22 PM   Specimen: BLOOD  Result Value Ref Range Status   Specimen Description   Final    BLOOD LEFT ANTECUBITAL Performed at [facility], [address] [place], [place] [address]    Special Requests   Final    BOTTLES DRAWN AEROBIC AND ANAEROBIC Blood Culture results may not be optimal due to an inadequate volume of blood received in culture bottles Performed at [facility], [address] [place], [place] [address]    Culture   Final    NO GROWTH 2 DAYS Performed at [facility], [address] [place], [place] [address]    Report Status PENDING  Incomplete  SARS CORONAVIRUS 2 (TAT 6-24 HRS) Nasopharyngeal Nasopharyngeal Swab     Status: None   Collection Time: 04/21/19  7:15 PM   Specimen: Nasopharyngeal Swab  Result Value Ref Range Status   SARS Coronavirus 2 NEGATIVE NEGATIVE Final    Comment: (NOTE) SARS-CoV-2 target nucleic acids are NOT DETECTED. The SARS-CoV-2 RNA is generally detectable in upper and lower respiratory specimens during the acute phase of infection. Negative results do not preclude SARS-CoV-2 infection, do not rule out co-infections with other pathogens, and should not be used as the sole basis for treatment or other patient management decisions. Negative results must be combined with clinical observations, patient history, and epidemiological information. The expected result is Negative. Fact Sheet for Patients: HairSlick.no Fact Sheet for Healthcare Providers: quierodirigir.com This test is not yet approved or cleared by the [place] FDA and    has been authorized for detection and/or diagnosis of SARS-CoV-2 by FDA under an Emergency Use Authorization (EUA). This EUA will remain  in effect (meaning this test can be used) for the duration of the COVID-19 declaration under Section 56 4(b)(1) of the Act, 21 U.S.C. section 360bbb-3(b)(1), unless the authorization is terminated or revoked sooner. Performed at [facility], [address], [place], [place] [address]   Culture, blood (routine x 2)  Status: None (Preliminary result)   Collection Time: 04/22/19  1:22 AM   Specimen: BLOOD LEFT FOREARM  Result Value Ref Range Status   Specimen Description BLOOD LEFT FOREARM  Final   Special Requests   Final    BOTTLES DRAWN AEROBIC AND ANAEROBIC Blood Culture adequate volume   Culture   Final    NO GROWTH 1 DAY Performed at [facility], [address], [place], [place] [address]    Report Status PENDING  Incomplete  Culture, blood (routine x 2)     Status: None (Preliminary result)   Collection Time: 04/22/19  1:32 AM   Specimen: BLOOD RIGHT FOREARM  Result Value Ref Range Status   Specimen Description BLOOD RIGHT FOREARM  Final   Special Requests   Final    AEROBIC BOTTLE ONLY Blood Culture results may not be optimal due to an inadequate volume of blood received in culture bottles   Culture   Final    NO GROWTH 1 DAY Performed at [facility], [address] [place], [place] [address]    Report Status PENDING  Incomplete         Radiology Studies: ECHOCARDIOGRAM COMPLETE  Result Date: 04/23/2019   ECHOCARDIOGRAM REPORT   Patient Name:   Vernon KATHAN [facility] Sr. Date of Exam: 04/23/2019 Medical Rec #:  [id number]         Height:       68.0 in Accession #:    [id number]        Weight:       147.0 lb Date of Birth:  [birth year]         BSA:          1.79 m Patient Age:    89 years          BP:           102/46 mmHg Patient Gender: M                 HR:           95 bpm. Exam Location:  Inpatient Procedure: 2D  Echo, Cardiac Doppler and Color Doppler Indications:    Cardiomegaly  History:        Patient has prior history of Echocardiogram examinations, most                 recent 09/20/2016. Signs/Symptoms:Hypotension and Altered Mental                 Status; Risk Factors:Hypertension and Dyslipidemia. UTI, LE                 edema.  Sonographer:    Lavenia Atlas Referring Phys: [phone] RONDELL A SMITH IMPRESSIONS  1. Left ventricular ejection fraction, by visual estimation, is 55 to 60%. The left ventricle has normal function. There is mildly increased left ventricular hypertrophy.  2. Left ventricular diastolic parameters are consistent with Grade I diastolic dysfunction (impaired relaxation).  3. The left ventricle has no regional wall motion abnormalities.  4. Global right ventricle was not well visualized.The right ventricular size is not well visualized. Right vetricular wall thickness was not assessed.  5. Left atrial size was normal.  6. Right atrial size was normal.  7. Moderate thickening of the mitral valve leaflet(s).  8. The mitral valve is abnormal. Mild mitral valve regurgitation.  9. The tricuspid valve is normal in structure. 10. Aortic valve area, by VTI measures 1.42 cm. 11. Aortic valve mean  gradient measures 8.0 mmHg. 12. Aortic valve peak gradient measures 15.0 mmHg. 13. The aortic valve is tricuspid. Aortic valve regurgitation is trivial. Mild aortic valve stenosis. 14. The pulmonic valve was grossly normal. Pulmonic valve regurgitation is not visualized. 15. Normal pulmonary artery systolic pressure. 16. The inferior vena cava is normal in size with greater than 50% respiratory variability, suggesting right atrial pressure of 3 mmHg. FINDINGS  Left Ventricle: Left ventricular ejection fraction, by visual estimation, is 55 to 60%. The left ventricle has normal function. The left ventricle has no regional wall motion abnormalities. There is mildly increased left ventricular hypertrophy. Left  ventricular diastolic parameters are consistent with Grade I diastolic dysfunction (impaired relaxation). Indeterminate filling pressures. Right Ventricle: The right ventricular size is not well visualized. Right vetricular wall thickness was not assessed. Global RV systolic function is was not well visualized. The tricuspid regurgitant velocity is 2.42 m/s, and with an assumed right atrial  pressure of 3 mmHg, the estimated right ventricular systolic pressure is normal at 26.4 mmHg. Left Atrium: Left atrial size was normal in size. Right Atrium: Right atrial size was normal in size Pericardium: There is no evidence of pericardial effusion. Mitral Valve: The mitral valve is abnormal. There is moderate thickening of the mitral valve leaflet(s). Mild mitral valve regurgitation. MV peak gradient, 12.7 mmHg. Tricuspid Valve: The tricuspid valve is normal in structure. Tricuspid valve regurgitation is trivial. Aortic Valve: The aortic valve is tricuspid. Aortic valve regurgitation is trivial. Aortic regurgitation PHT measures 488 msec. Mild aortic stenosis is present. Aortic valve mean gradient measures 8.0 mmHg. Aortic valve peak gradient measures 15.0 mmHg. Aortic valve area, by VTI measures 1.42 cm. Pulmonic Valve: The pulmonic valve was grossly normal. Pulmonic valve regurgitation is not visualized. Pulmonic regurgitation is not visualized. Aorta: The aortic root and ascending aorta are structurally normal, with no evidence of dilitation. Venous: The inferior vena cava is normal in size with greater than 50% respiratory variability, suggesting right atrial pressure of 3 mmHg. IAS/Shunts: No atrial level shunt detected by color flow Doppler.  LEFT VENTRICLE PLAX 2D LVIDd:         3.27 cm  Diastology LVIDs:         2.35 cm  LV e' lateral:   6.64 cm/s LV PW:         1.08 cm  LV E/e' lateral: 15.5 LV IVS:        1.17 cm  LV e' medial:    9.57 cm/s LVOT diam:     2.40 cm  LV E/e' medial:  10.8 LV SV:         24 ml LV SV  Index:   13.43 LVOT Area:     4.52 cm  RIGHT VENTRICLE RV Basal diam:  2.50 cm RV S prime:     5.44 cm/s TAPSE (M-mode): 1.7 cm LEFT ATRIUM             Index       RIGHT ATRIUM           Index LA diam:        3.30 cm 1.84 cm/m  RA Area:     18.90 cm LA Vol (A2C):   24.5 ml 13.66 ml/m RA Volume:   57.60 ml  32.12 ml/m LA Vol (A4C):   45.1 ml 25.15 ml/m LA Biplane Vol: 35.3 ml 19.68 ml/m  AORTIC VALVE AV Area (Vmax):    1.60 cm AV Area (Vmean):   1.47 cm AV Area (  VTI):     1.42 cm AV Vmax:           193.67 cm/s AV Vmean:          135.000 cm/s AV VTI:            0.375 m AV Peak Grad:      15.0 mmHg AV Mean Grad:      8.0 mmHg LVOT Vmax:         68.30 cm/s LVOT Vmean:        43.900 cm/s LVOT VTI:          0.118 m LVOT/AV VTI ratio: 0.31 AI PHT:            488 msec  AORTA Ao Root diam: 3.40 cm MITRAL VALVE                         TRICUSPID VALVE MV Area (PHT): 4.31 cm              TR Peak grad:   23.4 mmHg MV Peak grad:  12.7 mmHg             TR Vmax:        242.00 cm/s MV Mean grad:  4.0 mmHg MV Vmax:       1.78 m/s              SHUNTS MV Vmean:      92.0 cm/s             Systemic VTI:  0.12 m MV VTI:        0.44 m                Systemic Diam: 2.40 cm MV PHT:        51.04 msec MV Decel Time: 176 msec MV E velocity: 103.00 cm/s 103 cm/s MV A velocity: 169.00 cm/s 70.3 cm/s MV E/A ratio:  0.61        1.5  Zoila ShutterKenneth Hilty MD Electronically signed by Zoila ShutterKenneth Hilty MD Signature Date/Time: 04/23/2019/10:47:49 AM    Final         Scheduled Meds: . enoxaparin (LOVENOX) injection  40 mg Subcutaneous Q24H  . feeding supplement (ENSURE ENLIVE)  237 mL Oral BID BM  . sodium chloride flush  3 mL Intravenous Q12H   Continuous Infusions: . ceFEPime (MAXIPIME) IV 2 g (04/23/19 2142)  . vancomycin 750 mg (04/23/19 1054)          Glade LloydKshitiz Quita Mcgrory, MD [facility] 04/24/2019, 9:48 AM

## 2019-04-25 DIAGNOSIS — I10 Essential (primary) hypertension: Secondary | ICD-10-CM

## 2019-04-25 DIAGNOSIS — R338 Other retention of urine: Secondary | ICD-10-CM

## 2019-04-25 MED ORDER — CHLORHEXIDINE GLUCONATE CLOTH 2 % EX PADS
6.0000 | MEDICATED_PAD | Freq: Every day | CUTANEOUS | Status: DC
  Administered 2019-04-25 – 2019-04-28 (×4): 6 via TOPICAL

## 2019-04-25 NOTE — Progress Notes (Signed)
The chaplain visited with the patient and offered supportive conversation.  The patient requested that the chaplain call their priest and the chaplain contacted their parish and another parish that they frequent.  None of the catholic churches were available except for emergencies  As this is not an emergent situation the chaplain will refer this to the unit chaplain.  Lavone Neri Chaplain Resident For questions concerning this note please contact me by pager [phone]

## 2019-04-25 NOTE — TOC Progression Note (Signed)
Transition of Care [facility]) - Progression Note    Patient Details  Name: Vernon Walton. MRN: [id number] Date of Birth: [birth year]  Transition of Care [facility]) CM/SW Contact  Doy Hutching, [place] Phone Number: 04/25/2019, 4:28 PM  Clinical Narrative:    Pt son not amenable to any of the current offers sent. Preference for [facility], [address], and [facility] (now also known as [facility] [place]). Have sent updated email indicating that [facility] is not accepting any referrals at this time due to COVID. [facility] and [facility] will both review pt Monday. Pt will also need new COVID swab for SNF placement.    Expected Discharge Plan: Skilled Nursing Facility    Expected Discharge Plan and Services Expected Discharge Plan: Skilled Nursing Facility Discharge Planning Services: CM Consult Living arrangements for the past 2 months: Single Family Home              DME Arranged: N/A HH Arranged: NA    Readmission Risk Interventions No flowsheet data found.

## 2019-04-25 NOTE — Progress Notes (Signed)
Pharmacy Antibiotic Note  Vernon CANTER MEO Sr. is a 86 y.o. male admitted on 04/21/2019 with sepsis.  Pharmacy has been consulted for Cefepime dosing.  ID: sepsis - unknown source. significant decubitus ulcers of bilateral buttocks, sacrum, lumbar and thoracic spine. MRI thoracic/lumbar spine - neg. Afebrile, LA 1.6, wbc 10.7, Scr 0.86  12/30 cefepime>> 12/30 vanc>> 12/31   12/29 BCx x1- ngtd 12/29 covid - neg 12/30: BC x 2>>  Plan: Cefepime to 2g IV q12h Pharmacy will sign off. Please reconsult for further dosing assitance.   Height: 5\' 8"  (172.7 cm) Weight: 146 lb (66.2 kg) IBW/kg (Calculated) : 68.4  Temp (24hrs), Avg:98.2 F (36.8 C), Min:98 F (36.7 C), Max:98.3 F (36.8 C)  Recent Labs  Lab 04/21/19 1722 04/21/19 2229 04/22/19 1300 04/22/19 1956 04/23/19 0516 04/24/19 0142  WBC 17.6*  --  16.1*  --  11.7* 10.7*  CREATININE 1.51*  --  1.13  --  0.94 0.86  LATICACIDVEN  --  2.7* 2.7* 1.6  --   --     Estimated Creatinine Clearance: 44.9 mL/min (by C-G formula based on SCr of 0.86 mg/dL).    No Known Allergies   Jonh Mcqueary S. 06/22/19, PharmD, BCPS Clinical Staff Pharmacist [email or web address] Vernon Walton 04/25/2019 9:52 AM

## 2019-04-25 NOTE — Progress Notes (Signed)
Patient ID: Vernon HawkingARMAND [name] Sr., male   DOB: 1918-10-30, 86 y.o.   MRN: [id number]  PROGRESS NOTE    Vernon HawkingRMAND [name] Sr.  NWG:[id number] DOB: 1918-10-30 DOA: 04/21/2019 PCP: Merri BrunettePharr, Walter, MD   Brief Narrative:  86 year old male with history of hypertension, hyperlipidemia and arthritis, recent urinary retention for which Foley catheter was placed in the ED on 04/15/2019 presented with low blood pressure.  On presentation to the ED, he had WBCs of 78.6; Covid antigen test was negative.  Suspicion was negative for infiltrates.  MRI of the thoracic and lumbar spine showed remote T5 compression fracture that was healed and severe degenerative disc disease with kyphoscoliosis, foraminal stenosis and areas of impingement.  Patient was started on IV fluids and antibiotics.  Neurosurgery was consulted and recommended conservative management.  PT recommended SNF placement.  Assessment & Plan:   Question of sepsis: Sepsis has been ruled out -Presented with leukocytosis with white count of 17.6 and lactic acid of 2.7.  Chest x-ray was negative for infiltrates.  Urinalysis was negative.  COVID-19 testing was negative.  Started on broad-spectrum antibiotics vancomycin and cefepime along with IV fluids.  IV fluids have been subsequently discontinued. -Cultures have been negative so far -There was a concern for decubitus ulcer infection but after wound care nurse evaluation, doubt that patient has decubitus ulcer infection.  Vancomycin discontinued on 04/24/2019.  Currently on cefepime.  Will DC cefepime and monitor off antibiotics.  Leukocytosis -Resolved  Hypotension -Blood pressure still on the lower side.  Will hold off on starting antihypertensives.  Bilateral lower extremity weakness -Patient has been unable to ambulate due to weakness.  MRI of the thoracic and lumbar spine showed remote T5 compression fracture that was healed and severe degenerative disc disease with kyphoscoliosis, foraminal stenosis and  areas of impingement - Neurosurgery was consulted and recommended conservative management.   -PT recommended SNF placement.  Social worker following.  Acute kidney injury -Presented with creatinine of 1.51.  Baseline creatinine of 0.7-0.8.  Treated with IV fluids.  Creatinine back to baseline.  Acute urinary retention -Status post Foley catheter placement on 04/15/2019 in the ED. Foley catheter removed on 04/24/2019.  Foley cath had to be reinserted early in the morning of 1-21.  Will need outpatient urology evaluation.  Stage I pressure injury to upper thoracic area: Present on admission Stage II pressure injuries, 3 in total in the sacral area: Present on admission -Wound care evaluation appreciated.  Wound care as per wound care evaluation and recommendations.  Generalized deconditioning -palliative care consultation for goals of care discussion is pending   DVT prophylaxis: Lovenox Code Status: DNR Family Communication: Spoke to son/Trevaun on phone on April 24, 2019 Disposition Plan: SNF once bed is available  Consultants: Neurosurgery  Procedures: None  Antimicrobials:  Anti-infectives (From admission, onward)   Start     Dose/Rate Route Frequency Ordered Stop   04/23/19 1000  vancomycin (VANCOREADY) IVPB 750 mg/150 mL  Status:  Discontinued     750 mg 150 mL/hr over 60 Minutes Intravenous Every 24 hours 04/22/19 1444 04/24/19 1008   04/23/19 0900  ceFEPIme (MAXIPIME) 2 g in sodium chloride 0.9 % 100 mL IVPB  Status:  Discontinued     2 g 200 mL/hr over 30 Minutes Intravenous Every 24 hours 04/22/19 1158 04/22/19 1446   04/23/19 0700  ceFEPIme (MAXIPIME) 1 g in sodium chloride 0.9 % 100 mL IVPB  Status:  Discontinued     1 g 200 mL/hr over  30 Minutes Intravenous Every 24 hours 04/22/19 0655 04/22/19 1158   04/22/19 2100  ceFEPIme (MAXIPIME) 2 g in sodium chloride 0.9 % 100 mL IVPB     2 g 200 mL/hr over 30 Minutes Intravenous Every 12 hours 04/22/19 1446     04/22/19  0700  vancomycin (VANCOREADY) IVPB 1250 mg/250 mL     1,250 mg 166.7 mL/hr over 90 Minutes Intravenous  Once 04/22/19 0648 04/22/19 1147   04/22/19 0700  ceFEPIme (MAXIPIME) 2 g in sodium chloride 0.9 % 100 mL IVPB     2 g 200 mL/hr over 30 Minutes Intravenous  Once 04/22/19 0648 04/22/19 0919   04/22/19 0655  vancomycin variable dose per unstable renal function (pharmacist dosing)  Status:  Discontinued      Does not apply See admin instructions 04/22/19 0655 04/22/19 1444       Subjective: Patient seen and examined at bedside.  Poor historian.  Sleepy, wakes up slightly, hardly answers any questions.  No overnight fever or vomiting. Objective: Vitals:   04/25/19 0021 04/25/19 0500 04/25/19 0514 04/25/19 0751  BP: (!) 105/56  (!) 106/51 (!) 96/49  Pulse: 88  88 72  Resp: 18  18   Temp: 98 F (36.7 C)  98.3 F (36.8 C)   TempSrc: Oral  Oral   SpO2: 95%  95%   Weight:  66.2 kg    Height:        Intake/Output Summary (Last 24 hours) at 04/25/2019 0805 Last data filed at 04/24/2019 2310 Gross per 24 hour  Intake 123 ml  Output 400 ml  Net -277 ml   Filed Weights   04/23/19 0549 04/24/19 0553 04/25/19 0500  Weight: 66.7 kg 65 kg 66.2 kg    Examination:  General exam: No acute distress. Sleepy, wakes up slightly, hardly answers any questions. Elderly male lying in bed.  Poor historian. Respiratory system: Bilateral decreased breath sounds at bases with scattered crackles Cardiovascular system: Rate controlled, S1-S2 heard Gastrointestinal system: Abdomen is nondistended, soft and nontender. Normal bowel sounds heard. Extremities: No cyanosis; trace lower extremity edema  Data Reviewed: I have personally reviewed following labs and imaging studies  CBC: Recent Labs  Lab 04/21/19 1722 04/22/19 1300 04/23/19 0516 04/24/19 0142  WBC 17.6* 16.1* 11.7* 10.7*  HGB 13.0 13.1 11.4* 11.8*  HCT 40.6 40.3 34.9* 35.7*  MCV 103.0* 102.0* 100.3* 100.3*  PLT 226 231 219 245    Basic Metabolic Panel: Recent Labs  Lab 04/21/19 1722 04/22/19 1300 04/23/19 0516 04/24/19 0142  NA 138 136 136 138  K 4.7 3.9 3.7 3.6  CL 100 103 105 106  CO2 24 19* 21* 23  GLUCOSE 105* 115* 117* 113*  BUN 53* 49* 37* 31*  CREATININE 1.51* 1.13 0.94 0.86  CALCIUM 8.4* 8.5* 8.1* 8.4*   GFR: Estimated Creatinine Clearance: 44.9 mL/min (by C-G formula based on SCr of 0.86 mg/dL). Liver Function Tests: Recent Labs  Lab 04/21/19 1722 04/24/19 0142  AST 29 20  ALT 19 18  ALKPHOS 53 54  BILITOT 1.1 0.8  PROT 6.4* 5.3*  ALBUMIN 2.6* 2.0*   No results for input(s): LIPASE, AMYLASE in the last 168 hours. No results for input(s): AMMONIA in the last 168 hours. Coagulation Profile: No results for input(s): INR, PROTIME in the last 168 hours. Cardiac Enzymes: Recent Labs  Lab 04/22/19 1755  CKTOTAL 295   BNP (last 3 results) No results for input(s): PROBNP in the last 8760 hours. HbA1C: No results  for input(s): HGBA1C in the last 72 hours. CBG: No results for input(s): GLUCAP in the last 168 hours. Lipid Profile: No results for input(s): CHOL, HDL, LDLCALC, TRIG, CHOLHDL, LDLDIRECT in the last 72 hours. Thyroid Function Tests: No results for input(s): TSH, T4TOTAL, FREET4, T3FREE, THYROIDAB in the last 72 hours. Anemia Panel: No results for input(s): VITAMINB12, FOLATE, FERRITIN, TIBC, IRON, RETICCTPCT in the last 72 hours. Sepsis Labs: Recent Labs  Lab 04/21/19 2229 04/22/19 1300 04/22/19 1956  LATICACIDVEN 2.7* 2.7* 1.6    Recent Results (from the past 240 hour(s))  Urine culture     Status: None   Collection Time: 04/15/19  1:42 PM   Specimen: Urine, Clean Catch  Result Value Ref Range Status   Specimen Description   Final    URINE, CLEAN CATCH Performed at [facility], [place] [address] [place], [place] [address]    Special Requests   Final    NONE Performed at [facility], [place] [address], [place], [place]  [address]    Culture   Final    NO GROWTH Performed at [facility], [place] [address], [place], [place] [address]    Report Status 04/16/2019 FINAL  Final  Blood Culture x 1     Status: None (Preliminary result)   Collection Time: 04/21/19  5:22 PM   Specimen: BLOOD  Result Value Ref Range Status   Specimen Description   Final    BLOOD LEFT ANTECUBITAL Performed at [facility], [place] [address] [place], [place] [address]    Special Requests   Final    BOTTLES DRAWN AEROBIC AND ANAEROBIC Blood Culture results may not be optimal due to an inadequate volume of blood received in culture bottles Performed at [facility], [address], [place], [place] [address]    Culture   Final    NO GROWTH 3 DAYS Performed at [facility], [place] [address] [place], [place] [address]    Report Status PENDING  Incomplete  SARS CORONAVIRUS 2 (TAT 6-24 HRS) Nasopharyngeal Nasopharyngeal Swab     Status: None   Collection Time: 04/21/19  7:15 PM   Specimen: Nasopharyngeal Swab  Result Value Ref Range Status   SARS Coronavirus 2 NEGATIVE NEGATIVE Final    Comment: (NOTE) SARS-CoV-2 target nucleic acids are NOT DETECTED. The SARS-CoV-2 RNA is generally detectable in upper and lower respiratory specimens during the acute phase of infection. Negative results do not preclude SARS-CoV-2 infection, do not rule out co-infections with other pathogens, and should not be used as the sole basis for treatment or other patient management decisions. Negative results must be combined with clinical observations, patient history, and epidemiological information. The expected result is Negative. Fact Sheet for Patients: SugarRoll.be Fact Sheet for Healthcare Providers: https://www.woods-mathews.com/ This test is not yet approved or cleared by the [place] FDA and  has been authorized for detection and/or diagnosis  of SARS-CoV-2 by FDA under an Emergency Use Authorization (EUA). This EUA will remain  in effect (meaning this test can be used) for the duration of the COVID-19 declaration under Section 56 4(b)(1) of the Act, 21 U.S.C. section 360bbb-3(b)(1), unless the authorization is terminated or revoked sooner. Performed at [facility], [place] [address] [place], [place] [address]   Culture, blood (routine x 2)     Status: None (Preliminary result)   Collection Time: 04/22/19  1:22 AM   Specimen: BLOOD LEFT FOREARM  Result Value Ref Range Status   Specimen Description BLOOD LEFT FOREARM  Final   Special Requests   Final    BOTTLES DRAWN AEROBIC AND ANAEROBIC Blood Culture adequate volume   Culture   Final    NO GROWTH 2 DAYS Performed at [facility], [address] [place], [place] [address]    Report Status PENDING  Incomplete  Culture, blood (routine x 2)     Status: None (Preliminary result)   Collection Time: 04/22/19  1:32 AM   Specimen: BLOOD RIGHT FOREARM  Result Value Ref Range Status   Specimen Description BLOOD RIGHT FOREARM  Final   Special Requests   Final    AEROBIC BOTTLE ONLY Blood Culture results may not be optimal due to an inadequate volume of blood received in culture bottles   Culture   Final    NO GROWTH 2 DAYS Performed at [facility], [address] [place], [place] [address]    Report Status PENDING  Incomplete         Radiology Studies: ECHOCARDIOGRAM COMPLETE  Result Date: 04/23/2019   ECHOCARDIOGRAM REPORT   Patient Name:   Vernon HawkingRMAND Di [facility] Date of Exam: 04/23/2019 Medical Rec #:  [id number]         Height:       68.0 in Accession #:    [id number]        Weight:       147.0 lb Date of Birth:  28-Oct-1918         BSA:          1.79 m Patient Age:    86 years          BP:           102/46 mmHg Patient Gender: M                 HR:           95 bpm. Exam Location:  Inpatient Procedure: 2D Echo, Cardiac Doppler and Color Doppler Indications:     Cardiomegaly  History:        Patient has prior history of Echocardiogram examinations, most                 recent 09/20/2016. Signs/Symptoms:Hypotension and Altered Mental                 Status; Risk Factors:Hypertension and Dyslipidemia. UTI, LE                 edema.  Sonographer:    Lavenia AtlasBrooke Strickland Referring Phys: [phone] RONDELL A SMITH IMPRESSIONS  1. Left ventricular ejection fraction, by visual estimation, is 55 to 60%. The left ventricle has normal function. There is mildly increased left ventricular hypertrophy.  2. Left ventricular diastolic parameters are consistent with Grade I diastolic dysfunction (impaired relaxation).  3. The left ventricle has no regional wall motion abnormalities.  4. Global right ventricle was not well visualized.The right ventricular size is not well visualized. Right vetricular wall thickness was not assessed.  5. Left atrial size was normal.  6. Right atrial size was normal.  7. Moderate thickening of the mitral valve leaflet(s).  8. The mitral valve is abnormal. Mild mitral valve regurgitation.  9. The tricuspid valve is normal in structure. 10. Aortic valve area, by VTI measures 1.42 cm. 11. Aortic valve mean gradient measures 8.0 mmHg. 12. Aortic valve peak gradient measures 15.0 mmHg. 13. The aortic valve is tricuspid. Aortic  valve regurgitation is trivial. Mild aortic valve stenosis. 14. The pulmonic valve was grossly normal. Pulmonic valve regurgitation is not visualized. 15. Normal pulmonary artery systolic pressure. 16. The inferior vena cava is normal in size with greater than 50% respiratory variability, suggesting right atrial pressure of 3 mmHg. FINDINGS  Left Ventricle: Left ventricular ejection fraction, by visual estimation, is 55 to 60%. The left ventricle has normal function. The left ventricle has no regional wall motion abnormalities. There is mildly increased left ventricular hypertrophy. Left ventricular diastolic parameters are consistent with  Grade I diastolic dysfunction (impaired relaxation). Indeterminate filling pressures. Right Ventricle: The right ventricular size is not well visualized. Right vetricular wall thickness was not assessed. Global RV systolic function is was not well visualized. The tricuspid regurgitant velocity is 2.42 m/s, and with an assumed right atrial  pressure of 3 mmHg, the estimated right ventricular systolic pressure is normal at 26.4 mmHg. Left Atrium: Left atrial size was normal in size. Right Atrium: Right atrial size was normal in size Pericardium: There is no evidence of pericardial effusion. Mitral Valve: The mitral valve is abnormal. There is moderate thickening of the mitral valve leaflet(s). Mild mitral valve regurgitation. MV peak gradient, 12.7 mmHg. Tricuspid Valve: The tricuspid valve is normal in structure. Tricuspid valve regurgitation is trivial. Aortic Valve: The aortic valve is tricuspid. Aortic valve regurgitation is trivial. Aortic regurgitation PHT measures 488 msec. Mild aortic stenosis is present. Aortic valve mean gradient measures 8.0 mmHg. Aortic valve peak gradient measures 15.0 mmHg. Aortic valve area, by VTI measures 1.42 cm. Pulmonic Valve: The pulmonic valve was grossly normal. Pulmonic valve regurgitation is not visualized. Pulmonic regurgitation is not visualized. Aorta: The aortic root and ascending aorta are structurally normal, with no evidence of dilitation. Venous: The inferior vena cava is normal in size with greater than 50% respiratory variability, suggesting right atrial pressure of 3 mmHg. IAS/Shunts: No atrial level shunt detected by color flow Doppler.  LEFT VENTRICLE PLAX 2D LVIDd:         3.27 cm  Diastology LVIDs:         2.35 cm  LV e' lateral:   6.64 cm/s LV PW:         1.08 cm  LV E/e' lateral: 15.5 LV IVS:        1.17 cm  LV e' medial:    9.57 cm/s LVOT diam:     2.40 cm  LV E/e' medial:  10.8 LV SV:         24 ml LV SV Index:   13.43 LVOT Area:     4.52 cm  RIGHT  VENTRICLE RV Basal diam:  2.50 cm RV S prime:     5.44 cm/s TAPSE (M-mode): 1.7 cm LEFT ATRIUM             Index       RIGHT ATRIUM           Index LA diam:        3.30 cm 1.84 cm/m  RA Area:     18.90 cm LA Vol (A2C):   24.5 ml 13.66 ml/m RA Volume:   57.60 ml  32.12 ml/m LA Vol (A4C):   45.1 ml 25.15 ml/m LA Biplane Vol: 35.3 ml 19.68 ml/m  AORTIC VALVE AV Area (Vmax):    1.60 cm AV Area (Vmean):   1.47 cm AV Area (VTI):     1.42 cm AV Vmax:  193.67 cm/s AV Vmean:          135.000 cm/s AV VTI:            0.375 m AV Peak Grad:      15.0 mmHg AV Mean Grad:      8.0 mmHg LVOT Vmax:         68.30 cm/s LVOT Vmean:        43.900 cm/s LVOT VTI:          0.118 m LVOT/AV VTI ratio: 0.31 AI PHT:            488 msec  AORTA Ao Root diam: 3.40 cm MITRAL VALVE                         TRICUSPID VALVE MV Area (PHT): 4.31 cm              TR Peak grad:   23.4 mmHg MV Peak grad:  12.7 mmHg             TR Vmax:        242.00 cm/s MV Mean grad:  4.0 mmHg MV Vmax:       1.78 m/s              SHUNTS MV Vmean:      92.0 cm/s             Systemic VTI:  0.12 m MV VTI:        0.44 m                Systemic Diam: 2.40 cm MV PHT:        51.04 msec MV Decel Time: 176 msec MV E velocity: 103.00 cm/s 103 cm/s MV A velocity: 169.00 cm/s 70.3 cm/s MV E/A ratio:  0.61        1.5  Zoila Shutter MD Electronically signed by Zoila Shutter MD Signature Date/Time: 04/23/2019/10:47:49 AM    Final         Scheduled Meds: . enoxaparin (LOVENOX) injection  40 mg Subcutaneous Q24H  . feeding supplement (ENSURE ENLIVE)  237 mL Oral BID BM  . sodium chloride flush  3 mL Intravenous Q12H   Continuous Infusions: . ceFEPime (MAXIPIME) IV 2 g (04/24/19 2129)          Glade Lloyd, MD [facility] 04/25/2019, 8:05 AM

## 2019-04-26 LAB — CULTURE, BLOOD (SINGLE): Culture: NO GROWTH

## 2019-04-26 NOTE — Progress Notes (Signed)
Patient ID: Leanord Hawking MEO Sr., male   DOB: 1918/05/13, 86 y.o.   MRN: [id number]  PROGRESS NOTE    SAMAD THON MEO Sr.  VQM:[id number] DOB: Oct 23, 1918 DOA: 04/21/2019 PCP: Merri Brunette, MD   Brief Narrative:  86 year old male with history of hypertension, hyperlipidemia and arthritis, recent urinary retention for which Foley catheter was placed in the ED on 04/15/2019 presented with low blood pressure.  On presentation to the ED, he had WBCs of 78.6; Covid antigen test was negative.  Suspicion was negative for infiltrates.  MRI of the thoracic and lumbar spine showed remote T5 compression fracture that was healed and severe degenerative disc disease with kyphoscoliosis, foraminal stenosis and areas of impingement.  Patient was started on IV fluids and antibiotics.  Neurosurgery was consulted and recommended conservative management.  PT recommended SNF placement.  Assessment & Plan:   Question of sepsis: Sepsis has been ruled out -Presented with leukocytosis with white count of 17.6 and lactic acid of 2.7.  Chest x-ray was negative for infiltrates.  Urinalysis was negative.  COVID-19 testing was negative.  Started on broad-spectrum antibiotics vancomycin and cefepime along with IV fluids.  IV fluids have been subsequently discontinued. -Cultures have been negative so far -There was a concern for decubitus ulcer infection but after wound care nurse evaluation, doubt that patient has decubitus ulcer infection.  Vancomycin discontinued on 04/24/2019.  Cefepime discontinued on 04/25/2019.  Patient had a temperature of 100.8 at night on 04/25/2019.  If patient continues to have fever, will restart cefepime.  Leukocytosis -Resolved  Hypotension -Blood pressure still on the lower side.  Will hold off on starting antihypertensives.  Bilateral lower extremity weakness -Patient has been unable to ambulate due to weakness.  MRI of the thoracic and lumbar spine showed remote T5 compression fracture that was healed  and severe degenerative disc disease with kyphoscoliosis, foraminal stenosis and areas of impingement - Neurosurgery was consulted and recommended conservative management.   -PT recommended SNF placement.  Social worker following.  Acute kidney injury -Presented with creatinine of 1.51.  Baseline creatinine of 0.7-0.8.  Treated with IV fluids.  Creatinine back to baseline.  Acute urinary retention -Status post Foley catheter placement on 04/15/2019 in the ED. Foley catheter removed on 04/24/2019.  Foley cath had to be reinserted early in the morning of 1-21.  Will need outpatient urology evaluation.  Stage I pressure injury to upper thoracic area: Present on admission Stage II pressure injuries, 3 in total in the sacral area: Present on admission -Wound care evaluation appreciated.  Wound care as per wound care evaluation and recommendations.  Generalized deconditioning -palliative care consultation for goals of care discussion is pending   DVT prophylaxis: Lovenox Code Status: DNR Family Communication: Spoke to son/Narayan on phone on April 24, 2019 Disposition Plan: SNF once bed is available  Consultants: Neurosurgery  Procedures: None  Antimicrobials:  Anti-infectives (From admission, onward)   Start     Dose/Rate Route Frequency Ordered Stop   04/23/19 1000  vancomycin (VANCOREADY) IVPB 750 mg/150 mL  Status:  Discontinued     750 mg 150 mL/hr over 60 Minutes Intravenous Every 24 hours 04/22/19 1444 04/24/19 1008   04/23/19 0900  ceFEPIme (MAXIPIME) 2 g in sodium chloride 0.9 % 100 mL IVPB  Status:  Discontinued     2 g 200 mL/hr over 30 Minutes Intravenous Every 24 hours 04/22/19 1158 04/22/19 1446   04/23/19 0700  ceFEPIme (MAXIPIME) 1 g in sodium chloride 0.9 % 100 mL  IVPB  Status:  Discontinued     1 g 200 mL/hr over 30 Minutes Intravenous Every 24 hours 04/22/19 0655 04/22/19 1158   04/22/19 2100  ceFEPIme (MAXIPIME) 2 g in sodium chloride 0.9 % 100 mL IVPB  Status:   Discontinued     2 g 200 mL/hr over 30 Minutes Intravenous Every 12 hours 04/22/19 1446 04/25/19 1008   04/22/19 0700  vancomycin (VANCOREADY) IVPB 1250 mg/250 mL     1,250 mg 166.7 mL/hr over 90 Minutes Intravenous  Once 04/22/19 0648 04/22/19 1147   04/22/19 0700  ceFEPIme (MAXIPIME) 2 g in sodium chloride 0.9 % 100 mL IVPB     2 g 200 mL/hr over 30 Minutes Intravenous  Once 04/22/19 0648 04/22/19 0919   04/22/19 0655  vancomycin variable dose per unstable renal function (pharmacist dosing)  Status:  Discontinued      Does not apply See admin instructions 04/22/19 0655 04/22/19 1444       Subjective: Patient seen and examined at bedside.  Poor historian.  Patient had a temperature of 100.8 overnight as per the nursing staff.  No vomiting or worsening abdominal pain reported. Objective: Vitals:   04/25/19 1641 04/25/19 2309 04/26/19 0036 04/26/19 0509  BP: (!) 114/59 (!) 123/52  (!) 116/46  Pulse: 91 92  93  Resp: 16   15  Temp:  (!) 100.8 F (38.2 C) 99.1 F (37.3 C) (!) 97.5 F (36.4 C)  TempSrc:  Oral Oral Oral  SpO2:  98%  97%  Weight:    64.9 kg  Height:        Intake/Output Summary (Last 24 hours) at 04/26/2019 0801 Last data filed at 04/26/2019 0530 Gross per 24 hour  Intake 1720 ml  Output 850 ml  Net 870 ml   Filed Weights   04/24/19 0553 04/25/19 0500 04/26/19 0509  Weight: 65 kg 66.2 kg 64.9 kg    Examination:  General exam: No distress. Sleepy, wakes up slightly, hardly answers any questions. Elderly male lying in bed.  Poor historian. Respiratory system: Bilateral decreased breath sounds at bases with some crackles  cardiovascular system: S1-S2 heard, rate controlled Gastrointestinal system: Abdomen is nondistended, soft and nontender. Normal bowel sounds heard. Extremities: No cyanosis; trace lower extremity edema  Data Reviewed: I have personally reviewed following labs and imaging studies  CBC: Recent Labs  Lab 04/21/19 1722 04/22/19 1300  04/23/19 0516 04/24/19 0142  WBC 17.6* 16.1* 11.7* 10.7*  HGB 13.0 13.1 11.4* 11.8*  HCT 40.6 40.3 34.9* 35.7*  MCV 103.0* 102.0* 100.3* 100.3*  PLT 226 231 219 245   Basic Metabolic Panel: Recent Labs  Lab 04/21/19 1722 04/22/19 1300 04/23/19 0516 04/24/19 0142  NA 138 136 136 138  K 4.7 3.9 3.7 3.6  CL 100 103 105 106  CO2 24 19* 21* 23  GLUCOSE 105* 115* 117* 113*  BUN 53* 49* 37* 31*  CREATININE 1.51* 1.13 0.94 0.86  CALCIUM 8.4* 8.5* 8.1* 8.4*   GFR: Estimated Creatinine Clearance: 44 mL/min (by C-G formula based on SCr of 0.86 mg/dL). Liver Function Tests: Recent Labs  Lab 04/21/19 1722 04/24/19 0142  AST 29 20  ALT 19 18  ALKPHOS 53 54  BILITOT 1.1 0.8  PROT 6.4* 5.3*  ALBUMIN 2.6* 2.0*   No results for input(s): LIPASE, AMYLASE in the last 168 hours. No results for input(s): AMMONIA in the last 168 hours. Coagulation Profile: No results for input(s): INR, PROTIME in the last 168 hours. Cardiac  Enzymes: Recent Labs  Lab 04/22/19 1755  CKTOTAL 295   BNP (last 3 results) No results for input(s): PROBNP in the last 8760 hours. HbA1C: No results for input(s): HGBA1C in the last 72 hours. CBG: No results for input(s): GLUCAP in the last 168 hours. Lipid Profile: No results for input(s): CHOL, HDL, LDLCALC, TRIG, CHOLHDL, LDLDIRECT in the last 72 hours. Thyroid Function Tests: No results for input(s): TSH, T4TOTAL, FREET4, T3FREE, THYROIDAB in the last 72 hours. Anemia Panel: No results for input(s): VITAMINB12, FOLATE, FERRITIN, TIBC, IRON, RETICCTPCT in the last 72 hours. Sepsis Labs: Recent Labs  Lab 04/21/19 2229 04/22/19 1300 04/22/19 1956  LATICACIDVEN 2.7* 2.7* 1.6    Recent Results (from the past 240 hour(s))  Blood Culture x 1     Status: None (Preliminary result)   Collection Time: 04/21/19  5:22 PM   Specimen: BLOOD  Result Value Ref Range Status   Specimen Description   Final    BLOOD LEFT ANTECUBITAL Performed at [facility], [address], [place], [place] [address]    Special Requests   Final    BOTTLES DRAWN AEROBIC AND ANAEROBIC Blood Culture results may not be optimal due to an inadequate volume of blood received in culture bottles Performed at [facility], [address], [place], [place] [address]    Culture   Final    NO GROWTH 4 DAYS Performed at [facility], [address] [place], [place] [address]    Report Status PENDING  Incomplete  SARS CORONAVIRUS 2 (TAT 6-24 HRS) Nasopharyngeal Nasopharyngeal Swab     Status: None   Collection Time: 04/21/19  7:15 PM   Specimen: Nasopharyngeal Swab  Result Value Ref Range Status   SARS Coronavirus 2 NEGATIVE NEGATIVE Final    Comment: (NOTE) SARS-CoV-2 target nucleic acids are NOT DETECTED. The SARS-CoV-2 RNA is generally detectable in upper and lower respiratory specimens during the acute phase of infection. Negative results do not preclude SARS-CoV-2 infection, do not rule out co-infections with other pathogens, and should not be used as the sole basis for treatment or other patient management decisions. Negative results must be combined with clinical observations, patient history, and epidemiological information. The expected result is Negative. Fact Sheet for Patients: HairSlick.no Fact Sheet for Healthcare Providers: quierodirigir.com This test is not yet approved or cleared by the [place] FDA and  has been authorized for detection and/or diagnosis of SARS-CoV-2 by FDA under an Emergency Use Authorization (EUA). This EUA will remain  in effect (meaning this test can be used) for the duration of the COVID-19 declaration under Section 56 4(b)(1) of the Act, 21 U.S.C. section 360bbb-3(b)(1), unless the authorization is terminated or revoked sooner. Performed at [facility], [address] [place], [place] [address]     Culture, blood (routine x 2)     Status: None (Preliminary result)   Collection Time: 04/22/19  1:22 AM   Specimen: BLOOD LEFT FOREARM  Result Value Ref Range Status   Specimen Description BLOOD LEFT FOREARM  Final   Special Requests   Final    BOTTLES DRAWN AEROBIC AND ANAEROBIC Blood Culture adequate volume   Culture   Final    NO GROWTH 3 DAYS Performed at [facility], [address], [place], [place] [address]    Report Status PENDING  Incomplete  Culture, blood (routine x 2)     Status: None (Preliminary result)  Collection Time: 04/22/19  1:32 AM   Specimen: BLOOD RIGHT FOREARM  Result Value Ref Range Status   Specimen Description BLOOD RIGHT FOREARM  Final   Special Requests   Final    AEROBIC BOTTLE ONLY Blood Culture results may not be optimal due to an inadequate volume of blood received in culture bottles   Culture   Final    NO GROWTH 3 DAYS Performed at [facility], [address], [place], [place] [address]    Report Status PENDING  Incomplete         Radiology Studies: No results found.      Scheduled Meds: . Chlorhexidine Gluconate Cloth  6 each Topical Daily  . enoxaparin (LOVENOX) injection  40 mg Subcutaneous Q24H  . feeding supplement (ENSURE ENLIVE)  237 mL Oral BID BM  . sodium chloride flush  3 mL Intravenous Q12H   Continuous Infusions:         Aline August, MD [facility] 04/26/2019, 8:01 AM

## 2019-04-27 DIAGNOSIS — Z7189 Other specified counseling: Secondary | ICD-10-CM

## 2019-04-27 DIAGNOSIS — Z515 Encounter for palliative care: Secondary | ICD-10-CM

## 2019-04-27 LAB — CULTURE, BLOOD (ROUTINE X 2)
Culture: NO GROWTH
Culture: NO GROWTH
Special Requests: ADEQUATE

## 2019-04-27 LAB — SARS CORONAVIRUS 2 (TAT 6-24 HRS): SARS Coronavirus 2: NEGATIVE

## 2019-04-27 NOTE — Care Management Important Message (Signed)
Important Message  Patient Details  Name: Vernon KLUTH Sr. MRN: [id number] Date of Birth: June 18, 1918   Medicare Important Message Given:  Yes     Kani Jobson Stefan Church 04/27/2019, 3:50 PM

## 2019-04-27 NOTE — Progress Notes (Signed)
PT Cancellation Note  Patient Details Name: Vernon Walton Sr. MRN: [id number] DOB: 1918-12-21   Cancelled Treatment:    Reason Eval/Treat Not Completed: Other (comment).  Pt is adamant that he is comfortable and does not want to get OOB to side or do any exercises.  Will try again as time and pt allow.   Ivar Drape 04/27/2019, 1:45 PM   Samul Dada, PT MS Acute Rehab Dept. Number: [facility] [id number] and [facility] [phone]

## 2019-04-27 NOTE — TOC Progression Note (Addendum)
Transition of Care [facility]) - Progression Note    Patient Details  Name: Vernon Walton. MRN: [id number] Date of Birth: 1918-10-19  Transition of [facility]) CM/SW Contact  Doy Hutching, [place] Phone Number: 04/27/2019, 9:52 AM  Clinical Narrative:  2:49pm- Admissions on hold at [facility] due to [place] resident. At this time CSW has sent pt son all 5 rehab offers. COVID swab has been ordered for pt.   9:56am- Update received that [facility] is not accepting referrals at this time, [facility] is reviewing. Additional two offers of [facility] and [facility] have been sent to pt son email.     9:52am- CSW has spoken with [facility] and [address] and Rehab in [place] [facility]). They are reviewing pt referral at this time.    Expected Discharge Plan: Skilled Nursing Facility    Expected Discharge Plan and Services Expected Discharge Plan: Skilled Nursing Facility Discharge Planning Services: CM Consult Living arrangements for the past 2 months: Single Family Home           DME Arranged: N/A HH Arranged: NA   Readmission Risk Interventions No flowsheet data found.

## 2019-04-27 NOTE — Plan of Care (Signed)
°  Problem: Coping: °Goal: Level of anxiety will decrease °Outcome: Progressing °  °

## 2019-04-27 NOTE — Progress Notes (Signed)
Patient ID: Vernon Hawking MEO Sr., male   DOB: 1918-07-21, 86 y.o.   MRN: [id number]  PROGRESS NOTE    Vernon SERVELLON MEO Sr.  TDV:[id number] DOB: 09-26-1918 DOA: 04/21/2019 PCP: Merri Brunette, MD   Brief Narrative:  86 year old male with history of hypertension, hyperlipidemia and arthritis, recent urinary retention for which Foley catheter was placed in the ED on 04/15/2019 presented with low blood pressure.  On presentation to the ED, he had WBCs of 78.6; Covid antigen test was negative.  Suspicion was negative for infiltrates.  MRI of the thoracic and lumbar spine showed remote T5 compression fracture that was healed and severe degenerative disc disease with kyphoscoliosis, foraminal stenosis and areas of impingement.  Patient was started on IV fluids and antibiotics.  Neurosurgery was consulted and recommended conservative management.  PT recommended SNF placement.  Assessment & Plan:   Question of sepsis: Sepsis has been ruled out -Presented with leukocytosis with white count of 17.6 and lactic acid of 2.7.  Chest x-ray was negative for infiltrates.  Urinalysis was negative.  COVID-19 testing was negative.  Started on broad-spectrum antibiotics vancomycin and cefepime along with IV fluids.  IV fluids have been subsequently discontinued. -Cultures have been negative so far -There was a concern for decubitus ulcer infection but after wound care nurse evaluation, doubt that patient has decubitus ulcer infection.  Vancomycin discontinued on 04/24/2019.  Cefepime discontinued on 04/25/2019.  No temperature spikes.  Monitor off antibiotics.  Leukocytosis -Resolved  Hypotension -Blood pressure still on the lower side.  Will hold off on starting antihypertensives.  Bilateral lower extremity weakness -Patient has been unable to ambulate due to weakness.  MRI of the thoracic and lumbar spine showed remote T5 compression fracture that was healed and severe degenerative disc disease with kyphoscoliosis, foraminal  stenosis and areas of impingement - Neurosurgery was consulted and recommended conservative management.   -PT recommended SNF placement.  Social worker following.  Acute kidney injury -Presented with creatinine of 1.51.  Baseline creatinine of 0.7-0.8.  Treated with IV fluids.  Creatinine back to baseline.  Acute urinary retention -Status post Foley catheter placement on 04/15/2019 in the ED. Foley catheter removed on 04/24/2019.  Foley cath had to be reinserted early in the morning of 1-21.  Will need outpatient urology evaluation.  Stage I pressure injury to upper thoracic area: Present on admission Stage II pressure injuries, 3 in total in the sacral area: Present on admission -Wound care evaluation appreciated.  Wound care as per wound care evaluation and recommendations.  Generalized deconditioning -palliative care consultation for goals of care discussion is pending   DVT prophylaxis: Lovenox Code Status: DNR Family Communication: Spoke to son/Varick on phone on April 24, 2019 Disposition Plan: SNF once bed is available  Consultants: Neurosurgery  Procedures: None  Antimicrobials:  Anti-infectives (From admission, onward)   Start     Dose/Rate Route Frequency Ordered Stop   04/23/19 1000  vancomycin (VANCOREADY) IVPB 750 mg/150 mL  Status:  Discontinued     750 mg 150 mL/hr over 60 Minutes Intravenous Every 24 hours 04/22/19 1444 04/24/19 1008   04/23/19 0900  ceFEPIme (MAXIPIME) 2 g in sodium chloride 0.9 % 100 mL IVPB  Status:  Discontinued     2 g 200 mL/hr over 30 Minutes Intravenous Every 24 hours 04/22/19 1158 04/22/19 1446   04/23/19 0700  ceFEPIme (MAXIPIME) 1 g in sodium chloride 0.9 % 100 mL IVPB  Status:  Discontinued     1 g 200 mL/hr  over 30 Minutes Intravenous Every 24 hours 04/22/19 0655 04/22/19 1158   04/22/19 2100  ceFEPIme (MAXIPIME) 2 g in sodium chloride 0.9 % 100 mL IVPB  Status:  Discontinued     2 g 200 mL/hr over 30 Minutes Intravenous Every 12  hours 04/22/19 1446 04/25/19 1008   04/22/19 0700  vancomycin (VANCOREADY) IVPB 1250 mg/250 mL     1,250 mg 166.7 mL/hr over 90 Minutes Intravenous  Once 04/22/19 0648 04/22/19 1147   04/22/19 0700  ceFEPIme (MAXIPIME) 2 g in sodium chloride 0.9 % 100 mL IVPB     2 g 200 mL/hr over 30 Minutes Intravenous  Once 04/22/19 0648 04/22/19 0919   04/22/19 0655  vancomycin variable dose per unstable renal function (pharmacist dosing)  Status:  Discontinued      Does not apply See admin instructions 04/22/19 0655 04/22/19 1444       Subjective: Patient seen and examined at bedside.  Poor historian.  No overnight fever or vomiting reported.   Objective: Vitals:   04/26/19 1301 04/26/19 1553 04/26/19 2335 04/27/19 0452  BP: (!) 107/53 (!) 115/56 (!) 116/51 123/60  Pulse: 86 88 90 88  Resp: 18 18 18 18   Temp:  98 F (36.7 C) 99.3 F (37.4 C) 98.4 F (36.9 C)  TempSrc:   Oral Oral  SpO2: 97% 97% 97% 96%  Weight:    64.4 kg  Height:        Intake/Output Summary (Last 24 hours) at 04/27/2019 0751 Last data filed at 04/27/2019 0457 Gross per 24 hour  Intake 957 ml  Output 1450 ml  Net -493 ml   Filed Weights   04/25/19 0500 04/26/19 0509 04/27/19 0452  Weight: 66.2 kg 64.9 kg 64.4 kg    Examination:  General exam: No acute distress.  Elderly male lying in bed.  Poor historian. Respiratory system: Bilateral decreased breath sounds at bases with scattered crackles.  No wheezing  cardiovascular system: Rate controlled, S1-S2 heard Gastrointestinal system: Abdomen is nondistended, soft and nontender. Normal bowel sounds heard. Extremities: No cyanosis; trace lower extremity edema  Data Reviewed: I have personally reviewed following labs and imaging studies  CBC: Recent Labs  Lab 04/21/19 1722 04/22/19 1300 04/23/19 0516 04/24/19 0142  WBC 17.6* 16.1* 11.7* 10.7*  HGB 13.0 13.1 11.4* 11.8*  HCT 40.6 40.3 34.9* 35.7*  MCV 103.0* 102.0* 100.3* 100.3*  PLT 226 231 219 161    Basic Metabolic Panel: Recent Labs  Lab 04/21/19 1722 04/22/19 1300 04/23/19 0516 04/24/19 0142  NA 138 136 136 138  K 4.7 3.9 3.7 3.6  CL 100 103 105 106  CO2 24 19* 21* 23  GLUCOSE 105* 115* 117* 113*  BUN 53* 49* 37* 31*  CREATININE 1.51* 1.13 0.94 0.86  CALCIUM 8.4* 8.5* 8.1* 8.4*   GFR: Estimated Creatinine Clearance: 43.7 mL/min (by C-G formula based on SCr of 0.86 mg/dL). Liver Function Tests: Recent Labs  Lab 04/21/19 1722 04/24/19 0142  AST 29 20  ALT 19 18  ALKPHOS 53 54  BILITOT 1.1 0.8  PROT 6.4* 5.3*  ALBUMIN 2.6* 2.0*   No results for input(s): LIPASE, AMYLASE in the last 168 hours. No results for input(s): AMMONIA in the last 168 hours. Coagulation Profile: No results for input(s): INR, PROTIME in the last 168 hours. Cardiac Enzymes: Recent Labs  Lab 04/22/19 1755  CKTOTAL 295   BNP (last 3 results) No results for input(s): PROBNP in the last 8760 hours. HbA1C: No results for  input(s): HGBA1C in the last 72 hours. CBG: No results for input(s): GLUCAP in the last 168 hours. Lipid Profile: No results for input(s): CHOL, HDL, LDLCALC, TRIG, CHOLHDL, LDLDIRECT in the last 72 hours. Thyroid Function Tests: No results for input(s): TSH, T4TOTAL, FREET4, T3FREE, THYROIDAB in the last 72 hours. Anemia Panel: No results for input(s): VITAMINB12, FOLATE, FERRITIN, TIBC, IRON, RETICCTPCT in the last 72 hours. Sepsis Labs: Recent Labs  Lab 04/21/19 2229 04/22/19 1300 04/22/19 1956  LATICACIDVEN 2.7* 2.7* 1.6    Recent Results (from the past 240 hour(s))  Blood Culture x 1     Status: None   Collection Time: 04/21/19  5:22 PM   Specimen: BLOOD  Result Value Ref Range Status   Specimen Description   Final    BLOOD LEFT ANTECUBITAL Performed at [facility], [address], [place], [place] [address]    Special Requests   Final    BOTTLES DRAWN AEROBIC AND ANAEROBIC Blood Culture results may not be optimal due to an  inadequate volume of blood received in culture bottles Performed at [facility], [address], [place], [place] [address]    Culture   Final    NO GROWTH 5 DAYS Performed at [facility], [address], [place], [place] [address]    Report Status 04/26/2019 FINAL  Final  SARS CORONAVIRUS 2 (TAT 6-24 HRS) Nasopharyngeal Nasopharyngeal Swab     Status: None   Collection Time: 04/21/19  7:15 PM   Specimen: Nasopharyngeal Swab  Result Value Ref Range Status   SARS Coronavirus 2 NEGATIVE NEGATIVE Final    Comment: (NOTE) SARS-CoV-2 target nucleic acids are NOT DETECTED. The SARS-CoV-2 RNA is generally detectable in upper and lower respiratory specimens during the acute phase of infection. Negative results do not preclude SARS-CoV-2 infection, do not rule out co-infections with other pathogens, and should not be used as the sole basis for treatment or other patient management decisions. Negative results must be combined with clinical observations, patient history, and epidemiological information. The expected result is Negative. Fact Sheet for Patients: HairSlick.no Fact Sheet for Healthcare Providers: quierodirigir.com This test is not yet approved or cleared by the [place] FDA and  has been authorized for detection and/or diagnosis of SARS-CoV-2 by FDA under an Emergency Use Authorization (EUA). This EUA will remain  in effect (meaning this test can be used) for the duration of the COVID-19 declaration under Section 56 4(b)(1) of the Act, 21 U.S.C. section 360bbb-3(b)(1), unless the authorization is terminated or revoked sooner. Performed at [facility], [address], [place], [place] [address]   Culture, blood (routine x 2)     Status: None (Preliminary result)   Collection Time: 04/22/19  1:22 AM   Specimen: BLOOD LEFT FOREARM  Result Value Ref Range Status   Specimen  Description BLOOD LEFT FOREARM  Final   Special Requests   Final    BOTTLES DRAWN AEROBIC AND ANAEROBIC Blood Culture adequate volume   Culture   Final    NO GROWTH 4 DAYS Performed at [facility], [address], [place], [place] [address]    Report Status PENDING  Incomplete  Culture, blood (routine x 2)     Status: None (Preliminary result)   Collection Time: 04/22/19  1:32 AM   Specimen: BLOOD RIGHT FOREARM  Result Value Ref Range Status   Specimen Description BLOOD RIGHT FOREARM  Final   Special Requests  Final    AEROBIC BOTTLE ONLY Blood Culture results may not be optimal due to an inadequate volume of blood received in culture bottles   Culture   Final    NO GROWTH 4 DAYS Performed at [facility], [address] [place], [place] [address]    Report Status PENDING  Incomplete         Radiology Studies: No results found.      Scheduled Meds: . Chlorhexidine Gluconate Cloth  6 each Topical Daily  . enoxaparin (LOVENOX) injection  40 mg Subcutaneous Q24H  . feeding supplement (ENSURE ENLIVE)  237 mL Oral BID BM  . sodium chloride flush  3 mL Intravenous Q12H   Continuous Infusions:         Glade Lloyd, MD [facility] 04/27/2019, 7:51 AM

## 2019-04-27 NOTE — Consult Note (Signed)
Consultation Note Date: 04/27/19  Patient Name: Vernon [facility] Sr.  DOB: 1918-08-31  MRN: [id number]  Age / Sex: 86 y.o., male  PCP: Vernon Pretty, MD Referring Physician: Aline August, MD  Reason for Consultation: Establishing goals of care  HPI/Patient Profile: 86 y.o. male  with past medical history of hypertension, hyperlipidemia, arthritis, and recent urinary retention requiring foley catheter placement admitted on 04/21/2019 with low blood pressure. In ED, MRI spine revealed healed T5 compression fracture, severe degenerative disc disease with kyphoscoliosis, foraminal stenosis and areas of impingement. Neurosurgery consulted and recommended conservative management. Sepsis ruled out. PT recommending [facility] for rehab. Palliative medicine consultation for goals of care.   Clinical Assessment and Goals of Care:  I have reviewed medical records, discussed with care team, and met with patient at bedside to discuss goals of care. Vernon Walton is awake, alert, oriented, and able to participate in discussion. He denies pain or discomfort. He is wondering when he will be discharged.   Introduced Palliative Medicine as specialized medical care for people living with serious illness. It focuses on providing relief from the symptoms and stress of a serious illness. The goal is to improve quality of life for both the patient and the family.  We discussed a brief life review of the patient. Prior to hospitalization, patient living at home. He lives with his wife of 50+ years who has underlying dementia and has 24/7 caregivers. 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 He has one son, Vernon Walton. Strong Catholic faith.  Discussed course of hospitalization including diagnoses, interventions, and plan of care. He understands conservative management for aging spinal changes.   I attempted to elicit values and goals of  care important to the patient. Advanced directives, concepts specific to code status, artifical feeding and hydration were discussed. Vernon Walton confirms his decision for DNR code status and shares that he and his wife have had DNR for many years. He questions 'how much longer can I live?' and his surprise that God has allowed him to live 89 years. He states "I'm not afraid of dying" and "I've lived a life, been all over the world." He does feel that God wants him to live longer but when he is nearing EOL, he would desire to "live the rest of my days at home in peace." Patient shares his belief that his son understands his wishes.   At this point, patient is agreeable to discharge to [facility] for rehab with hopes of returning home. He is eager to know where he will go and how long he will be there. Reassured patient that SW is in contact with his son about facilities.   Therapeutic listening and emotional/spiritual support provided.    SUMMARY OF RECOMMENDATIONS    DNR  Continue current plan of care and medical management  Patient is agreeable to discharge to [facility] for rehab. He is hopeful to return home following rehab.  Patient speaks of his wishes against life-prolonging interventions and that he is 'not afraid of  dying.'   May benefit from outpatient palliative services for ongoing support and Vernon Walton discussions pending progression at rehab.  Code Status/Advance Care Planning:  DNR  Symptom Management:   Per attending  Palliative Prophylaxis:   Aspiration, Delirium Protocol, Frequent Pain Assessment, Oral Care and Turn Reposition  Psycho-social/Spiritual:   Desire for further Chaplaincy support: yes  Additional Recommendations: Caregiving  Support/Resources  Prognosis:   Unable to determine  Discharge Planning: [facility] for rehab with Palliative care service follow-up      Primary Diagnoses: Present on Admission: . Pressure injury of skin . Lower extremity  weakness . Sepsis (Vernon Walton) . AKI (acute kidney injury) ([place]) . Spinal stenosis . Degenerative disc disease, lumbar . Hypoalbuminemia . Hypertension . Anemia . Sacral decubitus ulcer, stage II (Vernon Walton) . Protein calorie malnutrition (Vernon Walton)   I have reviewed the medical record, interviewed the patient and family, and examined the patient. The following aspects are pertinent.  Past Medical History:  Diagnosis Date  . Arthritis   . Hyperlipidemia   . Hypertension    Social History   Socioeconomic History  . Marital status: Married    Spouse name: Not on file  . Number of children: Not on file  . Years of education: Not on file  . Highest education level: Not on file  Occupational History  . Not on file  Tobacco Use  . Smoking status: Never Smoker  . Smokeless tobacco: Never Used  Substance and Sexual Activity  . Alcohol use: Not on file  . Drug use: Not on file  . Sexual activity: Not on file  Other Topics Concern  . Not on file  Social History Narrative  . Not on file   Social Determinants of Health   Financial Resource Strain:   . Difficulty of Paying Living Expenses: Not on file  Food Insecurity:   . Worried About Charity fundraiser in the Last Year: Not on file  . Ran Out of Food in the Last Year: Not on file  Transportation Needs:   . Lack of Transportation (Medical): Not on file  . Lack of Transportation (Non-Medical): Not on file  Physical Activity:   . Days of Exercise per Week: Not on file  . Minutes of Exercise per Session: Not on file  Stress:   . Feeling of Stress : Not on file  Social Connections:   . Frequency of Communication with Friends and Family: Not on file  . Frequency of Social Gatherings with Friends and Family: Not on file  . Attends Religious Services: Not on file  . Active Member of Clubs or Organizations: Not on file  . Attends Archivist Meetings: Not on file  . Marital Status: Not on file   History reviewed. No pertinent  family history. Scheduled Meds: . Chlorhexidine Gluconate Cloth  6 each Topical Daily  . enoxaparin (LOVENOX) injection  40 mg Subcutaneous Q24H  . feeding supplement (ENSURE ENLIVE)  237 mL Oral BID BM  . sodium chloride flush  3 mL Intravenous Q12H   Continuous Infusions: PRN Meds:.acetaminophen **OR** acetaminophen, albuterol, ondansetron **OR** ondansetron (ZOFRAN) IV Medications Prior to Admission:  Prior to Admission medications   Medication Sig Start Date End Date Taking? Authorizing Provider  cephALEXin (KEFLEX) 250 MG capsule Take 250 mg by mouth 3 (three) times daily. 04/13/19  Yes [provider]  valsartan (DIOVAN) 320 MG tablet Take 1 tablet (320 mg total) by mouth daily. 09/03/16 04/21/19  Vernon August, MD  No Known Allergies Review of Systems  Constitutional: Positive for activity change.  Neurological: Positive for weakness.   Physical Exam Vitals and nursing note reviewed.  Constitutional:      General: He is awake.     Appearance: He is cachectic. He is ill-appearing.  HENT:     Head: Normocephalic and atraumatic.  Pulmonary:     Effort: No tachypnea, accessory muscle usage or respiratory distress.  Skin:    General: Skin is warm and dry.  Neurological:     Mental Status: He is alert and oriented to person, place, and time.  Psychiatric:        Attention and Perception: Attention normal.        Mood and Affect: Mood normal.        Speech: Speech normal.        Behavior: Behavior normal.        Cognition and Memory: Cognition normal.    Vital Signs: BP 123/60 (BP Location: Left Arm)   Pulse 88   Temp 98.4 F (36.9 C) (Oral)   Resp 18   Ht _0  (1.727 m)   Wt 64.4 kg   SpO2 96%   BMI 21.59 kg/m  Pain Scale: 0-10   Pain Score: 0-No pain   SpO2: SpO2: 96 % O2 Device:SpO2: 96 % O2 Flow Rate: .   IO: Intake/output summary:   Intake/Output Summary (Last 24 hours) at 04/27/2019 1103 Last data filed at 04/27/2019 0800 Gross per 24 hour   Intake 540 ml  Output 750 ml  Net -210 ml    LBM: Last BM Date: 04/23/19 Baseline Weight: Weight: 65.5 kg Most recent weight: Weight: 64.4 kg     Palliative Assessment/Data: PPS 50%     Time In/Out: 1020-1110  Time Total: 39mn Greater than 50%  of this time was spent counseling and coordinating care related to the above assessment and plan.  Signed by:  MIhor Dow DNP, FNP-C Palliative Medicine Team  Phone: [phone]: [phone] Please contact Palliative Medicine Team phone at [phone] questions and concerns.  For individual provider: See AShea Evans

## 2019-04-28 DIAGNOSIS — I82621 Acute embolism and thrombosis of deep veins of right upper extremity: Secondary | ICD-10-CM | POA: Diagnosis not present

## 2019-04-28 DIAGNOSIS — E46 Unspecified protein-calorie malnutrition: Secondary | ICD-10-CM | POA: Diagnosis not present

## 2019-04-28 DIAGNOSIS — F419 Anxiety disorder, unspecified: Secondary | ICD-10-CM | POA: Diagnosis not present

## 2019-04-28 DIAGNOSIS — M5136 Other intervertebral disc degeneration, lumbar region: Secondary | ICD-10-CM | POA: Diagnosis not present

## 2019-04-28 DIAGNOSIS — E8809 Other disorders of plasma-protein metabolism, not elsewhere classified: Secondary | ICD-10-CM | POA: Diagnosis not present

## 2019-04-28 DIAGNOSIS — I959 Hypotension, unspecified: Secondary | ICD-10-CM | POA: Diagnosis not present

## 2019-04-28 DIAGNOSIS — G8929 Other chronic pain: Secondary | ICD-10-CM | POA: Diagnosis not present

## 2019-04-28 DIAGNOSIS — N179 Acute kidney failure, unspecified: Secondary | ICD-10-CM | POA: Diagnosis not present

## 2019-04-28 DIAGNOSIS — Z452 Encounter for adjustment and management of vascular access device: Secondary | ICD-10-CM | POA: Diagnosis not present

## 2019-04-28 DIAGNOSIS — Z515 Encounter for palliative care: Secondary | ICD-10-CM

## 2019-04-28 DIAGNOSIS — R29898 Other symptoms and signs involving the musculoskeletal system: Secondary | ICD-10-CM | POA: Diagnosis not present

## 2019-04-28 DIAGNOSIS — L89144 Pressure ulcer of left lower back, stage 4: Secondary | ICD-10-CM | POA: Diagnosis not present

## 2019-04-28 DIAGNOSIS — M2550 Pain in unspecified joint: Secondary | ICD-10-CM | POA: Diagnosis not present

## 2019-04-28 DIAGNOSIS — E43 Unspecified severe protein-calorie malnutrition: Secondary | ICD-10-CM | POA: Diagnosis not present

## 2019-04-28 DIAGNOSIS — L89152 Pressure ulcer of sacral region, stage 2: Secondary | ICD-10-CM | POA: Diagnosis not present

## 2019-04-28 DIAGNOSIS — Z7189 Other specified counseling: Secondary | ICD-10-CM

## 2019-04-28 DIAGNOSIS — R600 Localized edema: Secondary | ICD-10-CM | POA: Diagnosis not present

## 2019-04-28 DIAGNOSIS — S31000D Unspecified open wound of lower back and pelvis without penetration into retroperitoneum, subsequent encounter: Secondary | ICD-10-CM | POA: Diagnosis not present

## 2019-04-28 DIAGNOSIS — M549 Dorsalgia, unspecified: Secondary | ICD-10-CM | POA: Diagnosis not present

## 2019-04-28 DIAGNOSIS — L89610 Pressure ulcer of right heel, unstageable: Secondary | ICD-10-CM | POA: Diagnosis not present

## 2019-04-28 DIAGNOSIS — R54 Age-related physical debility: Secondary | ICD-10-CM | POA: Diagnosis not present

## 2019-04-28 DIAGNOSIS — Z7401 Bed confinement status: Secondary | ICD-10-CM | POA: Diagnosis not present

## 2019-04-28 DIAGNOSIS — R627 Adult failure to thrive: Secondary | ICD-10-CM | POA: Diagnosis not present

## 2019-04-28 DIAGNOSIS — L89120 Pressure ulcer of left upper back, unstageable: Secondary | ICD-10-CM | POA: Diagnosis not present

## 2019-04-28 DIAGNOSIS — L89620 Pressure ulcer of left heel, unstageable: Secondary | ICD-10-CM | POA: Diagnosis not present

## 2019-04-28 DIAGNOSIS — L89150 Pressure ulcer of sacral region, unstageable: Secondary | ICD-10-CM | POA: Diagnosis not present

## 2019-04-28 DIAGNOSIS — I82C11 Acute embolism and thrombosis of right internal jugular vein: Secondary | ICD-10-CM | POA: Diagnosis not present

## 2019-04-28 DIAGNOSIS — Z23 Encounter for immunization: Secondary | ICD-10-CM | POA: Diagnosis not present

## 2019-04-28 DIAGNOSIS — L89140 Pressure ulcer of left lower back, unstageable: Secondary | ICD-10-CM | POA: Diagnosis not present

## 2019-04-28 DIAGNOSIS — R52 Pain, unspecified: Secondary | ICD-10-CM | POA: Diagnosis not present

## 2019-04-28 DIAGNOSIS — M48061 Spinal stenosis, lumbar region without neurogenic claudication: Secondary | ICD-10-CM | POA: Diagnosis not present

## 2019-04-28 DIAGNOSIS — L89154 Pressure ulcer of sacral region, stage 4: Secondary | ICD-10-CM | POA: Diagnosis not present

## 2019-04-28 DIAGNOSIS — W19XXXA Unspecified fall, initial encounter: Secondary | ICD-10-CM | POA: Diagnosis not present

## 2019-04-28 DIAGNOSIS — Z959 Presence of cardiac and vascular implant and graft, unspecified: Secondary | ICD-10-CM | POA: Diagnosis not present

## 2019-04-28 DIAGNOSIS — R531 Weakness: Secondary | ICD-10-CM | POA: Diagnosis not present

## 2019-04-28 DIAGNOSIS — I82A11 Acute embolism and thrombosis of right axillary vein: Secondary | ICD-10-CM | POA: Diagnosis not present

## 2019-04-28 DIAGNOSIS — L89890 Pressure ulcer of other site, unstageable: Secondary | ICD-10-CM | POA: Diagnosis not present

## 2019-04-28 DIAGNOSIS — L98499 Non-pressure chronic ulcer of skin of other sites with unspecified severity: Secondary | ICD-10-CM | POA: Diagnosis not present

## 2019-04-28 DIAGNOSIS — I82B11 Acute embolism and thrombosis of right subclavian vein: Secondary | ICD-10-CM | POA: Diagnosis not present

## 2019-04-28 DIAGNOSIS — L97829 Non-pressure chronic ulcer of other part of left lower leg with unspecified severity: Secondary | ICD-10-CM | POA: Diagnosis not present

## 2019-04-28 DIAGNOSIS — I10 Essential (primary) hypertension: Secondary | ICD-10-CM | POA: Diagnosis not present

## 2019-04-28 DIAGNOSIS — M6281 Muscle weakness (generalized): Secondary | ICD-10-CM | POA: Diagnosis not present

## 2019-04-28 NOTE — Social Work (Signed)
Clinical Social Worker facilitated patient discharge including contacting patient family and facility to confirm patient discharge plans.  Clinical information faxed to facility and family agreeable with plan.  CSW arranged ambulance transport via [facility] to  [facility] RN to call [phone]  with report prior to discharge.  Clinical Social Worker will sign off for now as social work intervention is no longer needed. Please consult us again if new need arises.  Yanette Tripoli, MSW, LCSWA Clinical Social Worker   

## 2019-04-28 NOTE — TOC Transition Note (Signed)
Transition of Care [facility]) - CM/SW Discharge Note   Patient Details  Name: Vernon GASSERT Sr. MRN: [id number] Date of Birth: [birth year]  Transition of [facility]) CM/SW Contact:  Doy Hutching, LCSWA Phone Number: 04/28/2019, 10:37 AM   Clinical Narrative:    CSW spoke with pt son via telephone. He has chosen [facility] from offers provided. Pt stable for transfer, await bed information from SNF. DNR signed by MD, COVID swab has resulted negative. At this time d/c pending room information. Will call son once available and arrange [facility].    Final next level of care: Skilled Nursing Facility Barriers to Discharge: Barriers Resolved(pending bed information)   Patient Goals and CMS Choice Patient states their goals for this hospitalization and ongoing recovery are:: to get stronger CMS Medicare.gov Compare Post Acute Care list provided to:: Patient Represenative (must comment)(pt son) Choice offered to / list presented to : Adult Children  Discharge Placement PASRR number recieved: 04/22/19            Patient chooses bed at: [facility] Patient to be transferred to facility by: [facility] Name of family member notified: pt son Vernon Walton Patient and family notified of of transfer: 04/28/19  Discharge Plan and Services Discharge Planning Services: CM Consult     DME Arranged: N/A HH Arranged: NA   Readmission Risk Interventions No flowsheet data found.

## 2019-04-28 NOTE — Progress Notes (Signed)
Pt discharging to SNF, [facility]. Report given to facility earlier by RN Courtlanda Free. Information packet sent with transporters for facility.  Pt d/c'd with belongings, transported by [facility].

## 2019-04-28 NOTE — Progress Notes (Signed)
Physical Therapy Treatment Patient Details Name: Vernon Walton. MRN: [id number] DOB: Sep 05, 1918 Today's Date: 04/28/2019    History of Present Illness 86 yo male s/p BLE weakness, urinary retention, Lumbar degenerative chnages that given his age are non operable. PMHx; AKI, spinal stenosis, HLD, syncope, HTN.    PT Comments    Pt was seen for mobility to sit on side of bed, then to assist with sitting balance control. He is getting up to bedside with max help but is attempting to engage his muscles in core to control balance.  Did use a prop of R elbow on pillow for support, and requires min assist to exercise LE's in sitting.  Assist to ex for both movement of LE's and control of sitting balance support.  Follow up with him acutely to work on balance, endurance and strength as tolerated.   Follow Up Recommendations  SNF     Equipment Recommendations  None recommended by PT    Recommendations for Other Services       Precautions / Restrictions Precautions Precautions: Fall Precaution Comments: pressure sores on bottom and lower spine Restrictions Weight Bearing Restrictions: No Other Position/Activity Restrictions: use care with skin breakdown    Mobility  Bed Mobility Overal bed mobility: Needs Assistance Bed Mobility: Supine to Sit;Sit to Supine;Rolling Rolling: Mod assist Sidelying to sit: Max assist Supine to sit: Max assist Sit to supine: Max assist   General bed mobility comments: voluntarily uses hand rail on bed to maneuver  Transfers Overall transfer level: Needs assistance               General transfer comment: unable to attempt standing  Ambulation/Gait                 Stairs             Wheelchair Mobility    Modified Rankin (Stroke Patients Only)       Balance Overall balance assessment: Needs assistance Sitting-balance support: Feet supported;Bilateral upper extremity supported Sitting balance-Leahy Scale: Poor                                       Cognition Arousal/Alertness: Awake/alert Behavior During Therapy: WFL for tasks assessed/performed Overall Cognitive Status: No family/caregiver present to determine baseline cognitive functioning                                 General Comments: pt is more concerned despite reassurances that he is not a missing person      Exercises General Exercises - Lower Extremity Ankle Circles/Pumps: AROM;AAROM;5 reps Long Arc Quad: AAROM;Both;10 reps Heel Slides: AAROM;Both;10 reps    General Comments General comments (skin integrity, edema, etc.): Pt was seen for mobility and noted fragile skin with mult bandages on legs and spine      Pertinent Vitals/Pain Pain Assessment: No/denies pain    Home Living                      Prior Function            PT Goals (current goals can now be found in the care plan section) Acute Rehab PT Goals Patient Stated Goal: to feel better Progress towards PT goals: Progressing toward goals    Frequency    Min 2X/week  PT Plan Current plan remains appropriate    Co-evaluation              AM-PAC PT "6 Clicks" Mobility   Outcome Measure  Help needed turning from your back to your side while in a flat bed without using bedrails?: A Lot Help needed moving from lying on your back to sitting on the side of a flat bed without using bedrails?: A Lot Help needed moving to and from a bed to a chair (including a wheelchair)?: Total Help needed standing up from a chair using your arms (e.g., wheelchair or bedside chair)?: Total Help needed to walk in hospital room?: Total Help needed climbing 3-5 steps with a railing? : Total 6 Click Score: 8    End of Session   Activity Tolerance: Patient limited by fatigue;Treatment limited secondary to medical complications (Comment) Patient left: in bed;with call bell/phone within reach;with bed alarm set Nurse Communication:  Mobility status PT Visit Diagnosis: Muscle weakness (generalized) (M62.81);Adult, failure to thrive (R62.7)     Time: 4008-6761 PT Time Calculation (min) (ACUTE ONLY): 29 min  Charges:  $Therapeutic Exercise: 8-22 mins $Therapeutic Activity: 8-22 mins                    Ivar Drape 04/28/2019, 1:01 PM   Samul Dada, PT MS Acute Rehab Dept. Number: [facility] [id number] and [facility] [phone]

## 2019-04-28 NOTE — Discharge Summary (Signed)
Physician Discharge Summary  JERSON FURUKAWA MEO Sr. EQA:[id number] DOB: 06/24/1918 DOA: 04/21/2019  PCP: Merri Brunette, MD  Admit date: 04/21/2019 Discharge date: 04/28/2019  Admitted From: Home Disposition: SNF  Recommendations for Outpatient Follow-up:  1. Follow up with SNF provider at earliest convenience 2. He would benefit from outpatient benefit evaluation and follow-up 3. Outpatient follow-up with urology 4. Consider comfort measures/hospice if condition worsens 5. Follow up in ED if symptoms worsen or new appear   Home Health: No Equipment/Devices: Foley catheter  Discharge Condition: Guarded to poor CODE STATUS: DNR Diet recommendation: Heart healthy  Brief/Interim Summary: 86 year old male with history of hypertension, hyperlipidemia and arthritis, recent urinary retention for which Foley catheter was placed in the ED on 04/15/2019 presented with low blood pressure.  On presentation to the ED, he had WBCs of 78.6; Covid antigen test was negative.  Suspicion was negative for infiltrates.  MRI of the thoracic and lumbar spine showed remote T5 compression fracture that was healed and severe degenerative disc disease with kyphoscoliosis, foraminal stenosis and areas of impingement.  Patient was started on IV fluids and antibiotics.  Neurosurgery was consulted and recommended conservative management.  PT recommended SNF placement.  Discharge to SNF once bed is available.  Discharge Diagnoses:   Question of sepsis: Sepsis has been ruled out -Presented with leukocytosis with white count of 17.6 and lactic acid of 2.7.  Chest x-ray was negative for infiltrates.  Urinalysis was negative.  COVID-19 testing was negative.  Started on broad-spectrum antibiotics vancomycin and cefepime along with IV fluids.  IV fluids have been subsequently discontinued. -Cultures have been negative so far -There was a concern for decubitus ulcer infection but after wound care nurse evaluation, doubt that  patient has decubitus ulcer infection.  Vancomycin discontinued on 04/24/2019.  Cefepime discontinued on 04/25/2019.  No temperature spikes.  Monitor off antibiotics. -Discharge to SNF once bed is available  Leukocytosis -Resolved  Hypotension -Blood pressure still on the lower side.  Will hold off on starting antihypertensives.  Bilateral lower extremity weakness -Patient has been unable to ambulate due to weakness.  MRI of the thoracic and lumbar spine showed remote T5 compression fracture that was healed and severe degenerative disc disease with kyphoscoliosis, foraminal stenosis and areas of impingement - Neurosurgery was consulted and recommended conservative management.   -PT recommended SNF placement.   Acute kidney injury -Presented with creatinine of 1.51.  Baseline creatinine of 0.7-0.8.  Treated with IV fluids.  Creatinine back to baseline.  Monitor intermittently as an outpatient  Acute urinary retention -Status post Foley catheter placement on 04/15/2019 in the ED. Foley catheter removed on 04/24/2019.  Foley cath had to be reinserted early in the morning of 1-21.  Will need outpatient urology evaluation.  Stage I pressure injury to upper thoracic area: Present on admission Stage II pressure injuries, 3 in total in the sacral area: Present on admission -Wound care evaluation appreciated.  Wound care as per wound care evaluation and recommendations.  Generalized deconditioning -palliative care has evaluated the patient.  Patient will benefit from outpatient palliative care follow-up.  If condition worsens, consider hospice/comfort measures.  Discharge Instructions  Discharge Instructions    Diet - low sodium heart healthy   Complete by: As directed    Increase activity slowly   Complete by: As directed      Allergies as of 04/28/2019   No Known Allergies     Medication List    STOP taking these medications   cephALEXin 250  MG capsule Commonly known as: KEFLEX    valsartan 320 MG tablet Commonly known as: DIOVAN      Follow-up Information    [facility]. Schedule an appointment as soon as possible for a visit in 1 week(s).   Contact information: [address] [place] [address] [place] [address] [phone]       Palliative care. Schedule an appointment as soon as possible for a visit in 1 week(s).          No Known Allergies  Consultations:  Palliative care/neurosurgery   Procedures/Studies: CT Cervical Spine Wo Contrast  Result Date: 04/15/2019 CLINICAL DATA:  Progressive weakness. Not voiding. EXAM: CT CERVICAL SPINE WITHOUT CONTRAST TECHNIQUE: Multidetector CT imaging of the cervical spine was performed without intravenous contrast. Multiplanar CT image reconstructions were also generated. COMPARISON:  CT of the cervical spine 09/01/2016 FINDINGS: Alignment: Grade 1 anterolisthesis is present at C3-4, C4-5, C6-7, and C7-T1. There is ankylosis across the C5-6 disc space. Skull base and vertebrae: Advanced degenerative changes are present C1-2. Vertebral body heights are maintained. No acute or healing fractures are present. Soft tissues and spinal canal: No prevertebral fluid or swelling. No visible canal hematoma. Disc levels: Osseous foraminal narrowing is present from C3-4 through to C7-T1 right greater than left. Left-sided osseous foraminal narrowing is greatest at C4-5 and C6-7. Upper chest: The lung apices are clear. Thoracic inlet is normal. IMPRESSION: 1. Multilevel degenerative changes of the cervical spine as described. 2. No acute or healing fractures. Electronically Signed   By: Marin Roberts M.D.   On: 04/15/2019 16:52   CT Thoracic Spine Wo Contrast  Result Date: 04/15/2019 CLINICAL DATA:  Progressive weakness. Not voiding. EXAM: CT THORACIC SPINE WITHOUT CONTRAST TECHNIQUE: Multidetector CT images of the thoracic were obtained using the standard protocol without intravenous contrast.  COMPARISON:  None. FINDINGS: Alignment: No significant listhesis is present. Exaggerated thoracic kyphosis is noted. Vertebrae: A superior endplate fracture at T5 demonstrates 40% loss of height anteriorly. Fracture is age indeterminate and may be incompletely healed. Vertebral body heights are otherwise maintained. No other acute or healing fractures are present. Paraspinal and other soft tissues: No acute abnormality is present. A large hiatal hernia is present. Atherosclerotic changes are noted in the descending thoracic and abdominal aorta. Coronary artery calcifications are present as well. The visualized lung fields are otherwise clear. Disc levels: Advanced facet hypertrophy contributes to foraminal narrowing bilaterally at T10-11, T11-12, and T12-L1. A calcified disc is present at T7-8 without significant stenosis. Osseous foraminal narrowing is present bilaterally at T5-6. Facet hypertrophy is present at C7-T1 and T1-2 with right greater than left foraminal narrowing at these levels. IMPRESSION: 1. Superior endplate fracture at T5 demonstrates 40% loss of height anteriorly. Fracture is age indeterminate and may be incompletely healed. 2. No other acute trauma. 3. Exaggerated thoracic kyphosis. 4. Multilevel spondylosis of the thoracic spine as described. 5. Large hiatal hernia. 6. Aortic Atherosclerosis (ICD10-I70.0). Electronically Signed   By: Marin Roberts M.D.   On: 04/15/2019 16:24   CT Lumbar Spine Wo Contrast  Result Date: 04/15/2019 CLINICAL DATA:  Weakness. Patient is not voiding. EXAM: CT LUMBAR SPINE WITHOUT CONTRAST TECHNIQUE: Multidetector CT imaging of the lumbar spine was performed without intravenous contrast administration. Multiplanar CT image reconstructions were also generated. COMPARISON:  None. FINDINGS: Segmentation: 5 non rib-bearing lumbar type vertebral bodies are present. The lowest fully formed vertebral body is L5. Alignment: S shaped scoliosis of lumbar spine is  convex to the  right at L2 and to the left at L4-5. Grade 1 anterolisthesis at L4-5 measures 4 mm. It no other significant listhesis is present. Vertebrae: There is ankylosis across the disc spaces at L1-2, L2-3, and L3-4. Sclerotic endplate changes are most evident on the right at L4-5 and bilaterally at L5-S1. Vertebral body heights are maintained. Marked osteopenia is present. Paraspinal and other soft tissues: Dense atherosclerotic calcifications are present in the aorta and branch vessels. There is no aneurysm. No significant adenopathy is present. Disc levels: T12-L1: Advanced facet hypertrophy is present. A broad-based disc protrusion is noted. Moderate central and bilateral foraminal stenosis is present. L1-2: Advanced facet hypertrophy is worse left than right. Moderate central and bilateral foraminal stenosis is present. L2-3: A broad-based disc protrusion is present. Moderate facet hypertrophy is noted bilaterally. Moderate central and left greater than right foraminal narrowing is present. L3-4: A broad-based disc protrusion is present. Advanced facet hypertrophy and ligamentum flavum thickening contribute to moderate to severe central canal stenosis. Severe right and moderate left foraminal stenosis is present. L4-5: There is uncovering of a broad-based disc protrusion. Advanced facet hypertrophy and ligamentum flavum thickening contribute to severe central canal stenosis. Severe right and moderate left foraminal stenosis is present. L5-S1: A broad-based disc protrusion is present. Advanced facet hypertrophy and spurring is noted. Moderate central and right foraminal stenosis is present. Facet spurring contributes to severe left foraminal stenosis. IMPRESSION: 1. Scoliosis of the lumbar spine is convex to the right at L2 and to the left at L4-5. 2. Ankylosis across the disc spaces at L1-2, L2-3, and L3-4. 3. Severe osteopenia. 4. Moderate central and bilateral foraminal stenosis at T12-L1. 5. Severe  right and moderate left foraminal stenosis at L3-4 and L4-5. 6. Severe central canal stenosis at L4-5. 7. Moderate central and right foraminal stenosis at L5-S1. 8. Severe left foraminal stenosis at L5-S1. Electronically Signed   By: Marin Roberts M.D.   On: 04/15/2019 16:34   MR THORACIC SPINE WO CONTRAST  Result Date: 04/22/2019 CLINICAL DATA:  Hypotension. Recent compression fracture with urinary retention. EXAM: MRI THORACIC AND LUMBAR SPINE WITHOUT CONTRAST TECHNIQUE: Multiplanar and multiecho pulse sequences of the thoracic and lumbar spine were obtained without intravenous contrast. COMPARISON:  CT of the thoracic and lumbar spine 04/15/2019 FINDINGS: MRI THORACIC SPINE FINDINGS Alignment:  Scoliosis and exaggerated kyphosis. Vertebrae: Remote T5 compression fracture with moderate height loss. No acute fracture, discitis, or aggressive bone lesion. There is incomplete coverage of T1 due to patient spinal curvature. Cord:  Normal signal and morphology. Paraspinal and other soft tissues: Large hiatal hernia which is right eccentric. Small layering right pleural effusion Disc levels: Generalized disc narrowing and bulging. Generalized degenerative facet spurring and lower thoracic ligamentous thickening. MRI LUMBAR SPINE FINDINGS Segmentation:  5 lumbar type vertebral bodies Alignment: Degenerative reversal of normal lumbar lordosis with degenerative grade 1 anterolisthesis at L4-5 and T12-L1. Vertebrae: No evidence of fracture, discitis, or aggressive bone lesion. Fluid within the L4-5 and L5-S1 disc spaces where there is prominent vacuum phenomenon on prior CT. No endplate destruction or marrow edema Conus medullaris and cauda equina: Conus extends to the L1-2 level. Conus and cauda equina appear normal. Paraspinal and other soft tissues: Renal cystic intensities, large on the right. Muscular atrophy. Disc levels: T12- L1: Facet degeneration with spurring and mild anterolisthesis. The disc is  narrowed and bulging with right paracentral protrusion. High-grade right foraminal stenosis. Mild spinal stenosis L1-L2: Intervertebral ankylosis. The disc space is severely narrowed and there is  endplate and asymmetric left facet ridging. Moderate left foraminal narrowing L2-L3: Intervertebral ankylosis. Endplate and facet spurring more notable on the right where there is mild-to-moderate foraminal stenosis. Left subarticular recess narrowing which likely affects the descending L3 nerve root L3-L4: Intervertebral ankylosis. There is endplate and facet spurring asymmetric to the right where there is foraminal impingement. Left foraminal stenosis is moderate to high-grade. High-grade spinal stenosis. L4-L5: Severe facet degeneration with spurring and anterolisthesis. Narrow disc with complete disc space obliteration and fluid within the large fissure. Remarkably severe spinal stenosis with no visible residual thecal sac. Severe right foraminal stenosis with L4 compression L5-S1:Advanced disc degeneration with large fluid containing cleft. There is endplate and facet spurring. Moderate left foraminal impingement. IMPRESSION: MR THORACIC SPINE IMPRESSION No acute finding or cord impingement. The T5 compression fracture on prior CT is remote and healed. MR LUMBAR SPINE IMPRESSION 1. Severe degenerative disease with kyphoscoliosis and L4-5 listhesis. There is intervertebral ankylosis or solid fusion at L1-L4. 2. L4-5 severe spinal stenosis and right foraminal impingement. 3. L3-4 high-grade spinal stenosis and right foraminal impingement 4. L2-3 left foraminal and subarticular recess stenosis likely affecting the descending L3 nerve root. 5. T12-L1 advanced right foraminal stenosis. Electronically Signed   By: Marnee Spring M.D.   On: 04/22/2019 06:05   MR LUMBAR SPINE WO CONTRAST  Result Date: 04/22/2019 CLINICAL DATA:  Hypotension. Recent compression fracture with urinary retention. EXAM: MRI THORACIC AND LUMBAR  SPINE WITHOUT CONTRAST TECHNIQUE: Multiplanar and multiecho pulse sequences of the thoracic and lumbar spine were obtained without intravenous contrast. COMPARISON:  CT of the thoracic and lumbar spine 04/15/2019 FINDINGS: MRI THORACIC SPINE FINDINGS Alignment:  Scoliosis and exaggerated kyphosis. Vertebrae: Remote T5 compression fracture with moderate height loss. No acute fracture, discitis, or aggressive bone lesion. There is incomplete coverage of T1 due to patient spinal curvature. Cord:  Normal signal and morphology. Paraspinal and other soft tissues: Large hiatal hernia which is right eccentric. Small layering right pleural effusion Disc levels: Generalized disc narrowing and bulging. Generalized degenerative facet spurring and lower thoracic ligamentous thickening. MRI LUMBAR SPINE FINDINGS Segmentation:  5 lumbar type vertebral bodies Alignment: Degenerative reversal of normal lumbar lordosis with degenerative grade 1 anterolisthesis at L4-5 and T12-L1. Vertebrae: No evidence of fracture, discitis, or aggressive bone lesion. Fluid within the L4-5 and L5-S1 disc spaces where there is prominent vacuum phenomenon on prior CT. No endplate destruction or marrow edema Conus medullaris and cauda equina: Conus extends to the L1-2 level. Conus and cauda equina appear normal. Paraspinal and other soft tissues: Renal cystic intensities, large on the right. Muscular atrophy. Disc levels: T12- L1: Facet degeneration with spurring and mild anterolisthesis. The disc is narrowed and bulging with right paracentral protrusion. High-grade right foraminal stenosis. Mild spinal stenosis L1-L2: Intervertebral ankylosis. The disc space is severely narrowed and there is endplate and asymmetric left facet ridging. Moderate left foraminal narrowing L2-L3: Intervertebral ankylosis. Endplate and facet spurring more notable on the right where there is mild-to-moderate foraminal stenosis. Left subarticular recess narrowing which likely  affects the descending L3 nerve root L3-L4: Intervertebral ankylosis. There is endplate and facet spurring asymmetric to the right where there is foraminal impingement. Left foraminal stenosis is moderate to high-grade. High-grade spinal stenosis. L4-L5: Severe facet degeneration with spurring and anterolisthesis. Narrow disc with complete disc space obliteration and fluid within the large fissure. Remarkably severe spinal stenosis with no visible residual thecal sac. Severe right foraminal stenosis with L4 compression L5-S1:Advanced disc degeneration with large fluid  containing cleft. There is endplate and facet spurring. Moderate left foraminal impingement. IMPRESSION: MR THORACIC SPINE IMPRESSION No acute finding or cord impingement. The T5 compression fracture on prior CT is remote and healed. MR LUMBAR SPINE IMPRESSION 1. Severe degenerative disease with kyphoscoliosis and L4-5 listhesis. There is intervertebral ankylosis or solid fusion at L1-L4. 2. L4-5 severe spinal stenosis and right foraminal impingement. 3. L3-4 high-grade spinal stenosis and right foraminal impingement 4. L2-3 left foraminal and subarticular recess stenosis likely affecting the descending L3 nerve root. 5. T12-L1 advanced right foraminal stenosis. Electronically Signed   By: Marnee SpringJonathon  Watts M.D.   On: 04/22/2019 06:05   XR Chest Single View  Result Date: 04/21/2019 CLINICAL DATA:  Hypotension EXAM: PORTABLE CHEST 1 VIEW COMPARISON:  None. FINDINGS: Enlarged cardiac silhouette that could be due to cardiomegaly and/or pericardial fluid. Retrocardiac density presumed associated with a hiatal hernia. Aortic atherosclerosis. The lungs are clear. No evidence of heart failure or effusion. IMPRESSION: Enlarged cardiac silhouette that could be cardiomegaly and/or pericardial fluid. Probable hiatal hernia. Aortic atherosclerosis. Lungs clear.  No edema. Electronically Signed   By: Paulina FusiMark  Shogry M.D.   On: 04/21/2019 17:51   ECHOCARDIOGRAM  COMPLETE  Result Date: 04/23/2019   ECHOCARDIOGRAM REPORT   Patient Name:   Leanord [facility]RMAND [facility] Date of Exam: 04/23/2019 Medical Rec #:  [id number]         Height:       68.0 in Accession #:    [id number]        Weight:       147.0 lb Date of Birth:  1919/03/10         BSA:          1.79 m Patient Age:    86 years          BP:           102/46 mmHg Patient Gender: M                 HR:           95 bpm. Exam Location:  Inpatient Procedure: 2D Echo, Cardiac Doppler and Color Doppler Indications:    Cardiomegaly  History:        Patient has prior history of Echocardiogram examinations, most                 recent 09/20/2016. Signs/Symptoms:Hypotension and Altered Mental                 Status; Risk Factors:Hypertension and Dyslipidemia. UTI, LE                 edema.  Sonographer:    Lavenia AtlasBrooke Strickland Referring Phys: [phone] RONDELL A SMITH IMPRESSIONS  1. Left ventricular ejection fraction, by visual estimation, is 55 to 60%. The left ventricle has normal function. There is mildly increased left ventricular hypertrophy.  2. Left ventricular diastolic parameters are consistent with Grade I diastolic dysfunction (impaired relaxation).  3. The left ventricle has no regional wall motion abnormalities.  4. Global right ventricle was not well visualized.The right ventricular size is not well visualized. Right vetricular wall thickness was not assessed.  5. Left atrial size was normal.  6. Right atrial size was normal.  7. Moderate thickening of the mitral valve leaflet(s).  8. The mitral valve is abnormal. Mild mitral valve regurgitation.  9. The tricuspid valve is normal in structure. 10. Aortic valve area, by VTI measures 1.42 cm. 11. Aortic valve mean gradient measures  8.0 mmHg. 12. Aortic valve peak gradient measures 15.0 mmHg. 13. The aortic valve is tricuspid. Aortic valve regurgitation is trivial. Mild aortic valve stenosis. 14. The pulmonic valve was grossly normal. Pulmonic valve regurgitation is not visualized.  15. Normal pulmonary artery systolic pressure. 16. The inferior vena cava is normal in size with greater than 50% respiratory variability, suggesting right atrial pressure of 3 mmHg. FINDINGS  Left Ventricle: Left ventricular ejection fraction, by visual estimation, is 55 to 60%. The left ventricle has normal function. The left ventricle has no regional wall motion abnormalities. There is mildly increased left ventricular hypertrophy. Left ventricular diastolic parameters are consistent with Grade I diastolic dysfunction (impaired relaxation). Indeterminate filling pressures. Right Ventricle: The right ventricular size is not well visualized. Right vetricular wall thickness was not assessed. Global RV systolic function is was not well visualized. The tricuspid regurgitant velocity is 2.42 m/s, and with an assumed right atrial  pressure of 3 mmHg, the estimated right ventricular systolic pressure is normal at 26.4 mmHg. Left Atrium: Left atrial size was normal in size. Right Atrium: Right atrial size was normal in size Pericardium: There is no evidence of pericardial effusion. Mitral Valve: The mitral valve is abnormal. There is moderate thickening of the mitral valve leaflet(s). Mild mitral valve regurgitation. MV peak gradient, 12.7 mmHg. Tricuspid Valve: The tricuspid valve is normal in structure. Tricuspid valve regurgitation is trivial. Aortic Valve: The aortic valve is tricuspid. Aortic valve regurgitation is trivial. Aortic regurgitation PHT measures 488 msec. Mild aortic stenosis is present. Aortic valve mean gradient measures 8.0 mmHg. Aortic valve peak gradient measures 15.0 mmHg. Aortic valve area, by VTI measures 1.42 cm. Pulmonic Valve: The pulmonic valve was grossly normal. Pulmonic valve regurgitation is not visualized. Pulmonic regurgitation is not visualized. Aorta: The aortic root and ascending aorta are structurally normal, with no evidence of dilitation. Venous: The inferior vena cava is normal  in size with greater than 50% respiratory variability, suggesting right atrial pressure of 3 mmHg. IAS/Shunts: No atrial level shunt detected by color flow Doppler.  LEFT VENTRICLE PLAX 2D LVIDd:         3.27 cm  Diastology LVIDs:         2.35 cm  LV e' lateral:   6.64 cm/s LV PW:         1.08 cm  LV E/e' lateral: 15.5 LV IVS:        1.17 cm  LV e' medial:    9.57 cm/s LVOT diam:     2.40 cm  LV E/e' medial:  10.8 LV SV:         24 ml LV SV Index:   13.43 LVOT Area:     4.52 cm  RIGHT VENTRICLE RV Basal diam:  2.50 cm RV S prime:     5.44 cm/s TAPSE (M-mode): 1.7 cm LEFT ATRIUM             Index       RIGHT ATRIUM           Index LA diam:        3.30 cm 1.84 cm/m  RA Area:     18.90 cm LA Vol (A2C):   24.5 ml 13.66 ml/m RA Volume:   57.60 ml  32.12 ml/m LA Vol (A4C):   45.1 ml 25.15 ml/m LA Biplane Vol: 35.3 ml 19.68 ml/m  AORTIC VALVE AV Area (Vmax):    1.60 cm AV Area (Vmean):   1.47 cm AV Area (VTI):  1.42 cm AV Vmax:           193.67 cm/s AV Vmean:          135.000 cm/s AV VTI:            0.375 m AV Peak Grad:      15.0 mmHg AV Mean Grad:      8.0 mmHg LVOT Vmax:         68.30 cm/s LVOT Vmean:        43.900 cm/s LVOT VTI:          0.118 m LVOT/AV VTI ratio: 0.31 AI PHT:            488 msec  AORTA Ao Root diam: 3.40 cm MITRAL VALVE                         TRICUSPID VALVE MV Area (PHT): 4.31 cm              TR Peak grad:   23.4 mmHg MV Peak grad:  12.7 mmHg             TR Vmax:        242.00 cm/s MV Mean grad:  4.0 mmHg MV Vmax:       1.78 m/s              SHUNTS MV Vmean:      92.0 cm/s             Systemic VTI:  0.12 m MV VTI:        0.44 m                Systemic Diam: 2.40 cm MV PHT:        51.04 msec MV Decel Time: 176 msec MV E velocity: 103.00 cm/s 103 cm/s MV A velocity: 169.00 cm/s 70.3 cm/s MV E/A ratio:  0.61        1.5  Zoila Shutter MD Electronically signed by Zoila Shutter MD Signature Date/Time: 04/23/2019/10:47:49 AM    Final        Subjective: Patient seen and examined at  bedside.  He is sleepy, wakes up only very slightly, does not communicate much.  Poor historian.  No overnight fever or vomiting reported.  Discharge Exam: Vitals:   04/28/19 0102 04/28/19 0548  BP: 130/89 (!) 116/59  Pulse: 96 82  Resp: 16 16  Temp: (!) 97.3 F (36.3 C) 97.8 F (36.6 C)  SpO2: 98% 96%    General: Pt is sleepy, wakes up slightly, hardly answers any questions.  Poor historian.  Elderly gentleman lying in bed.   Cardiovascular: rate controlled, S1/S2 + Respiratory: bilateral decreased breath sounds at bases with some scattered crackles Abdominal: Soft, NT, ND, bowel sounds + Extremities: no edema, no cyanosis    The results of significant diagnostics from this hospitalization (including imaging, microbiology, ancillary and laboratory) are listed below for reference.     Microbiology: Recent Results (from the past 240 hour(s))  Blood Culture x 1     Status: None   Collection Time: 04/21/19  5:22 PM   Specimen: BLOOD  Result Value Ref Range Status   Specimen Description   Final    BLOOD LEFT ANTECUBITAL Performed at [facility], [address] [place], [place] [address]    Special Requests   Final    BOTTLES DRAWN AEROBIC AND ANAEROBIC Blood Culture results may not be optimal due to an  inadequate volume of blood received in culture bottles Performed at [facility], [address] [place], [place] [address]    Culture   Final    NO GROWTH 5 DAYS Performed at [facility], [place] [address], [place], [place] [address]    Report Status 04/26/2019 FINAL  Final  SARS CORONAVIRUS 2 (TAT 6-24 HRS) Nasopharyngeal Nasopharyngeal Swab     Status: None   Collection Time: 04/21/19  7:15 PM   Specimen: Nasopharyngeal Swab  Result Value Ref Range Status   SARS Coronavirus 2 NEGATIVE NEGATIVE Final    Comment: (NOTE) SARS-CoV-2 target nucleic acids are NOT DETECTED. The SARS-CoV-2 RNA is generally detectable in  upper and lower respiratory specimens during the acute phase of infection. Negative results do not preclude SARS-CoV-2 infection, do not rule out co-infections with other pathogens, and should not be used as the sole basis for treatment or other patient management decisions. Negative results must be combined with clinical observations, patient history, and epidemiological information. The expected result is Negative. Fact Sheet for Patients: SugarRoll.be Fact Sheet for Healthcare Providers: https://www.woods-mathews.com/ This test is not yet approved or cleared by the [place] FDA and  has been authorized for detection and/or diagnosis of SARS-CoV-2 by FDA under an Emergency Use Authorization (EUA). This EUA will remain  in effect (meaning this test can be used) for the duration of the COVID-19 declaration under Section 56 4(b)(1) of the Act, 21 U.S.C. section 360bbb-3(b)(1), unless the authorization is terminated or revoked sooner. Performed at [facility], [place] [address] [place], [place] [address]   Culture, blood (routine x 2)     Status: None   Collection Time: 04/22/19  1:22 AM   Specimen: BLOOD LEFT FOREARM  Result Value Ref Range Status   Specimen Description BLOOD LEFT FOREARM  Final   Special Requests   Final    BOTTLES DRAWN AEROBIC AND ANAEROBIC Blood Culture adequate volume   Culture   Final    NO GROWTH 5 DAYS Performed at [facility], [place] [address] [place], [place] [address]    Report Status 04/27/2019 FINAL  Final  Culture, blood (routine x 2)     Status: None   Collection Time: 04/22/19  1:32 AM   Specimen: BLOOD RIGHT FOREARM  Result Value Ref Range Status   Specimen Description BLOOD RIGHT FOREARM  Final   Special Requests   Final    AEROBIC BOTTLE ONLY Blood Culture results may not be optimal due to an inadequate volume of blood received in culture bottles   Culture   Final    NO GROWTH 5  DAYS Performed at [facility], [place] [address] [place], [place] [address]    Report Status 04/27/2019 FINAL  Final  SARS CORONAVIRUS 2 (TAT 6-24 HRS) Nasopharyngeal Nasopharyngeal Swab     Status: None   Collection Time: 04/27/19 12:15 PM   Specimen: Nasopharyngeal Swab  Result Value Ref Range Status   SARS Coronavirus 2 NEGATIVE NEGATIVE Final    Comment: (NOTE) SARS-CoV-2 target nucleic acids are NOT DETECTED. The SARS-CoV-2 RNA is generally detectable in upper and lower respiratory specimens during the acute phase of infection. Negative results do not preclude SARS-CoV-2 infection, do not rule out co-infections with other pathogens, and should not be used as the sole basis for treatment or other patient management decisions. Negative results must be combined with clinical observations, patient history, and epidemiological information. The expected result is  Negative. Fact Sheet for Patients: HairSlick.no Fact Sheet for Healthcare Providers: quierodirigir.com This test is not yet approved or cleared by the [place] FDA and  has been authorized for detection and/or diagnosis of SARS-CoV-2 by FDA under an Emergency Use Authorization (EUA). This EUA will remain  in effect (meaning this test can be used) for the duration of the COVID-19 declaration under Section 56 4(b)(1) of the Act, 21 U.S.C. section 360bbb-3(b)(1), unless the authorization is terminated or revoked sooner. Performed at [facility], [address], [place], [place] [address]      Labs: BNP (last 3 results) Recent Labs    04/24/19 0142  BNP 90.2   Basic Metabolic Panel: Recent Labs  Lab 04/21/19 1722 04/22/19 1300 04/23/19 0516 04/24/19 0142  NA 138 136 136 138  K 4.7 3.9 3.7 3.6  CL 100 103 105 106  CO2 24 19* 21* 23  GLUCOSE 105* 115* 117* 113*  BUN 53* 49* 37* 31*  CREATININE 1.51* 1.13 0.94 0.86  CALCIUM 8.4* 8.5* 8.1*  8.4*   Liver Function Tests: Recent Labs  Lab 04/21/19 1722 04/24/19 0142  AST 29 20  ALT 19 18  ALKPHOS 53 54  BILITOT 1.1 0.8  PROT 6.4* 5.3*  ALBUMIN 2.6* 2.0*   No results for input(s): LIPASE, AMYLASE in the last 168 hours. No results for input(s): AMMONIA in the last 168 hours. CBC: Recent Labs  Lab 04/21/19 1722 04/22/19 1300 04/23/19 0516 04/24/19 0142  WBC 17.6* 16.1* 11.7* 10.7*  HGB 13.0 13.1 11.4* 11.8*  HCT 40.6 40.3 34.9* 35.7*  MCV 103.0* 102.0* 100.3* 100.3*  PLT 226 231 219 245   Cardiac Enzymes: Recent Labs  Lab 04/22/19 1755  CKTOTAL 295   BNP: Invalid input(s): POCBNP CBG: No results for input(s): GLUCAP in the last 168 hours. D-Dimer No results for input(s): DDIMER in the last 72 hours. Hgb A1c No results for input(s): HGBA1C in the last 72 hours. Lipid Profile No results for input(s): CHOL, HDL, LDLCALC, TRIG, CHOLHDL, LDLDIRECT in the last 72 hours. Thyroid function studies No results for input(s): TSH, T4TOTAL, T3FREE, THYROIDAB in the last 72 hours.  Invalid input(s): FREET3 Anemia work up No results for input(s): VITAMINB12, FOLATE, FERRITIN, TIBC, IRON, RETICCTPCT in the last 72 hours. Urinalysis    Component Value Date/Time   COLORURINE YELLOW 04/21/2019 1722   APPEARANCEUR HAZY (A) 04/21/2019 1722   LABSPEC 1.017 04/21/2019 1722   PHURINE 5.0 04/21/2019 1722   GLUCOSEU NEGATIVE 04/21/2019 1722   HGBUR SMALL (A) 04/21/2019 1722   BILIRUBINUR NEGATIVE 04/21/2019 1722   KETONESUR NEGATIVE 04/21/2019 1722   PROTEINUR 30 (A) 04/21/2019 1722   NITRITE NEGATIVE 04/21/2019 1722   LEUKOCYTESUR NEGATIVE 04/21/2019 1722   Sepsis Labs Invalid input(s): PROCALCITONIN,  WBC,  LACTICIDVEN Microbiology Recent Results (from the past 240 hour(s))  Blood Culture x 1     Status: None   Collection Time: 04/21/19  5:22 PM   Specimen: BLOOD  Result Value Ref Range Status   Specimen Description   Final    BLOOD LEFT  ANTECUBITAL Performed at [facility], [address], [place], [place] [address]    Special Requests   Final    BOTTLES DRAWN AEROBIC AND ANAEROBIC Blood Culture results may not be optimal due to an inadequate volume of blood received in culture bottles Performed at [facility], [address], [place], [place] [address]    Culture   Final  NO GROWTH 5 DAYS Performed at [facility], [address] [place], [place] [address]    Report Status 04/26/2019 FINAL  Final  SARS CORONAVIRUS 2 (TAT 6-24 HRS) Nasopharyngeal Nasopharyngeal Swab     Status: None   Collection Time: 04/21/19  7:15 PM   Specimen: Nasopharyngeal Swab  Result Value Ref Range Status   SARS Coronavirus 2 NEGATIVE NEGATIVE Final    Comment: (NOTE) SARS-CoV-2 target nucleic acids are NOT DETECTED. The SARS-CoV-2 RNA is generally detectable in upper and lower respiratory specimens during the acute phase of infection. Negative results do not preclude SARS-CoV-2 infection, do not rule out co-infections with other pathogens, and should not be used as the sole basis for treatment or other patient management decisions. Negative results must be combined with clinical observations, patient history, and epidemiological information. The expected result is Negative. Fact Sheet for Patients: HairSlick.nohttps://www.fda.gov/media/138098/download Fact Sheet for Healthcare Providers: quierodirigir.comhttps://www.fda.gov/media/138095/download This test is not yet approved or cleared by the [place] FDA and  has been authorized for detection and/or diagnosis of SARS-CoV-2 by FDA under an Emergency Use Authorization (EUA). This EUA will remain  in effect (meaning this test can be used) for the duration of the COVID-19 declaration under Section 56 4(b)(1) of the Act, 21 U.S.C. section 360bbb-3(b)(1), unless the authorization is terminated or revoked sooner. Performed at [facility],  [address] [place], [place] [address]   Culture, blood (routine x 2)     Status: None   Collection Time: 04/22/19  1:22 AM   Specimen: BLOOD LEFT FOREARM  Result Value Ref Range Status   Specimen Description BLOOD LEFT FOREARM  Final   Special Requests   Final    BOTTLES DRAWN AEROBIC AND ANAEROBIC Blood Culture adequate volume   Culture   Final    NO GROWTH 5 DAYS Performed at [facility], [address] [place], [place] [address]    Report Status 04/27/2019 FINAL  Final  Culture, blood (routine x 2)     Status: None   Collection Time: 04/22/19  1:32 AM   Specimen: BLOOD RIGHT FOREARM  Result Value Ref Range Status   Specimen Description BLOOD RIGHT FOREARM  Final   Special Requests   Final    AEROBIC BOTTLE ONLY Blood Culture results may not be optimal due to an inadequate volume of blood received in culture bottles   Culture   Final    NO GROWTH 5 DAYS Performed at [facility], [address] [place], [place] [address]    Report Status 04/27/2019 FINAL  Final  SARS CORONAVIRUS 2 (TAT 6-24 HRS) Nasopharyngeal Nasopharyngeal Swab     Status: None   Collection Time: 04/27/19 12:15 PM   Specimen: Nasopharyngeal Swab  Result Value Ref Range Status   SARS Coronavirus 2 NEGATIVE NEGATIVE Final    Comment: (NOTE) SARS-CoV-2 target nucleic acids are NOT DETECTED. The SARS-CoV-2 RNA is generally detectable in upper and lower respiratory specimens during the acute phase of infection. Negative results do not preclude SARS-CoV-2 infection, do not rule out co-infections with other pathogens, and should not be used as the sole basis for treatment or other patient management decisions. Negative results must be combined with clinical observations, patient history, and epidemiological information. The expected result is Negative. Fact Sheet for Patients: HairSlick.nohttps://www.fda.gov/media/138098/download Fact Sheet for Healthcare  Providers: quierodirigir.comhttps://www.fda.gov/media/138095/download This test is not yet approved or cleared by the [place] FDA and  has been authorized for detection  and/or diagnosis of SARS-CoV-2 by FDA under an Emergency Use Authorization (EUA). This EUA will remain  in effect (meaning this test can be used) for the duration of the COVID-19 declaration under Section 56 4(b)(1) of the Act, 21 U.S.C. section 360bbb-3(b)(1), unless the authorization is terminated or revoked sooner. Performed at [facility], [address] [place], [place] [address]      Time coordinating discharge: 35 minutes  SIGNED:   Glade Lloyd, MD  [facility] 04/28/2019, 10:38 AM

## 2019-04-30 ENCOUNTER — Other Ambulatory Visit: Payer: Self-pay | Admitting: *Deleted

## 2019-04-30 DIAGNOSIS — M549 Dorsalgia, unspecified: Secondary | ICD-10-CM | POA: Diagnosis not present

## 2019-04-30 DIAGNOSIS — R29898 Other symptoms and signs involving the musculoskeletal system: Secondary | ICD-10-CM | POA: Diagnosis not present

## 2019-04-30 DIAGNOSIS — S31000D Unspecified open wound of lower back and pelvis without penetration into retroperitoneum, subsequent encounter: Secondary | ICD-10-CM | POA: Diagnosis not present

## 2019-04-30 DIAGNOSIS — R531 Weakness: Secondary | ICD-10-CM | POA: Diagnosis not present

## 2019-04-30 DIAGNOSIS — I10 Essential (primary) hypertension: Secondary | ICD-10-CM | POA: Diagnosis not present

## 2019-05-01 DIAGNOSIS — L89150 Pressure ulcer of sacral region, unstageable: Secondary | ICD-10-CM | POA: Diagnosis not present

## 2019-05-01 DIAGNOSIS — L89620 Pressure ulcer of left heel, unstageable: Secondary | ICD-10-CM | POA: Diagnosis not present

## 2019-05-01 DIAGNOSIS — L89890 Pressure ulcer of other site, unstageable: Secondary | ICD-10-CM | POA: Diagnosis not present

## 2019-05-01 DIAGNOSIS — L89140 Pressure ulcer of left lower back, unstageable: Secondary | ICD-10-CM | POA: Diagnosis not present

## 2019-05-01 NOTE — Patient Outreach (Addendum)
Late entry for 04/30/19     Screened for potential [facility] Care Management needs a benefit of [employer].  Writer attended telephonic interdisciplinary team meeting to assess disposition needs and transition plan for resident.   Facility dc planner reports member was under palliative care services prior. Will likely need hospice. Has multiple wounds. Son lives in [place]. Has 2 friends listed as emergency contact. Facility dc planner to follow up with son regarding disposition plans.   Will continue to follow while member resides in SNF.   Raiford Noble, MSN-Ed, RN,BSN [facility] Post Acute Care Coordinator [phone] [facility]) [phone]  (Toll free office)

## 2019-05-07 ENCOUNTER — Other Ambulatory Visit: Payer: Self-pay | Admitting: *Deleted

## 2019-05-07 DIAGNOSIS — L89620 Pressure ulcer of left heel, unstageable: Secondary | ICD-10-CM | POA: Diagnosis not present

## 2019-05-07 DIAGNOSIS — L89154 Pressure ulcer of sacral region, stage 4: Secondary | ICD-10-CM | POA: Diagnosis not present

## 2019-05-07 DIAGNOSIS — L89150 Pressure ulcer of sacral region, unstageable: Secondary | ICD-10-CM | POA: Diagnosis not present

## 2019-05-07 DIAGNOSIS — L89120 Pressure ulcer of left upper back, unstageable: Secondary | ICD-10-CM | POA: Diagnosis not present

## 2019-05-07 NOTE — Patient Outreach (Signed)
Screened for potential [facility] Care Management needs as a benefit of  [employer].  Mr. Vernon Walton is currently receiving skilled therapy at [facility].  Writer attended  telephonic interdisciplinary team meeting to assess for disposition needs and transition plan for resident.   Facility reports member is from home with wife with 24 hr caregivers. Member requires daily wound care. Facility reports wounds were present on admission. Facility also reports palliative care referral has been made. Transition plan LTC vs home with 24 caregiver assist.  Will continue to follow for transition plans and potential [facility] Care Management needs.    Raiford Noble, MSN-Ed, RN,BSN [facility] Post Acute Care Coordinator [phone] [facility]) [phone]  (Toll free office)

## 2019-05-11 DIAGNOSIS — F419 Anxiety disorder, unspecified: Secondary | ICD-10-CM | POA: Diagnosis not present

## 2019-05-11 DIAGNOSIS — R52 Pain, unspecified: Secondary | ICD-10-CM | POA: Diagnosis not present

## 2019-05-11 DIAGNOSIS — R54 Age-related physical debility: Secondary | ICD-10-CM | POA: Diagnosis not present

## 2019-05-11 DIAGNOSIS — M6281 Muscle weakness (generalized): Secondary | ICD-10-CM | POA: Diagnosis not present

## 2019-05-14 ENCOUNTER — Other Ambulatory Visit: Payer: Self-pay | Admitting: *Deleted

## 2019-05-14 DIAGNOSIS — L89610 Pressure ulcer of right heel, unstageable: Secondary | ICD-10-CM | POA: Diagnosis not present

## 2019-05-14 DIAGNOSIS — E43 Unspecified severe protein-calorie malnutrition: Secondary | ICD-10-CM | POA: Diagnosis not present

## 2019-05-14 DIAGNOSIS — L89150 Pressure ulcer of sacral region, unstageable: Secondary | ICD-10-CM | POA: Diagnosis not present

## 2019-05-14 DIAGNOSIS — L89620 Pressure ulcer of left heel, unstageable: Secondary | ICD-10-CM | POA: Diagnosis not present

## 2019-05-14 DIAGNOSIS — L98499 Non-pressure chronic ulcer of skin of other sites with unspecified severity: Secondary | ICD-10-CM | POA: Diagnosis not present

## 2019-05-14 DIAGNOSIS — L89154 Pressure ulcer of sacral region, stage 4: Secondary | ICD-10-CM | POA: Diagnosis not present

## 2019-05-14 DIAGNOSIS — L89140 Pressure ulcer of left lower back, unstageable: Secondary | ICD-10-CM | POA: Diagnosis not present

## 2019-05-14 DIAGNOSIS — L89120 Pressure ulcer of left upper back, unstageable: Secondary | ICD-10-CM | POA: Diagnosis not present

## 2019-05-14 DIAGNOSIS — G8929 Other chronic pain: Secondary | ICD-10-CM | POA: Diagnosis not present

## 2019-05-14 NOTE — Patient Outreach (Signed)
Screened for potential [facility] Care Management needs as a benefit of  [employer].  Member is currently receiving skilled care at [facility].   Writer attended telephonic interdisciplinary team meeting to assess for disposition needs and transition plan for resident.   Facility reports member continues with wound care and iv antibiotics. Facility dc planner reports transition plan is for long term care. Inquired whether palliative care team is following.   Will continue to follow for transition plans and potential [facility] needs.   Raiford Noble, MSN-Ed, RN,BSN [facility] Post Acute Care Coordinator [phone] [facility]) [phone]  (Toll free office)

## 2019-05-19 DIAGNOSIS — G8929 Other chronic pain: Secondary | ICD-10-CM | POA: Diagnosis not present

## 2019-05-19 DIAGNOSIS — R600 Localized edema: Secondary | ICD-10-CM | POA: Diagnosis not present

## 2019-05-19 DIAGNOSIS — R627 Adult failure to thrive: Secondary | ICD-10-CM | POA: Diagnosis not present

## 2019-05-19 DIAGNOSIS — E43 Unspecified severe protein-calorie malnutrition: Secondary | ICD-10-CM | POA: Diagnosis not present

## 2019-05-19 DIAGNOSIS — I82621 Acute embolism and thrombosis of deep veins of right upper extremity: Secondary | ICD-10-CM | POA: Diagnosis not present

## 2019-05-19 DIAGNOSIS — L89140 Pressure ulcer of left lower back, unstageable: Secondary | ICD-10-CM | POA: Diagnosis not present

## 2019-05-19 DIAGNOSIS — R54 Age-related physical debility: Secondary | ICD-10-CM | POA: Diagnosis not present

## 2019-05-19 DIAGNOSIS — L89150 Pressure ulcer of sacral region, unstageable: Secondary | ICD-10-CM | POA: Diagnosis not present

## 2019-05-20 DIAGNOSIS — R600 Localized edema: Secondary | ICD-10-CM | POA: Diagnosis not present

## 2019-05-20 DIAGNOSIS — G8929 Other chronic pain: Secondary | ICD-10-CM | POA: Diagnosis not present

## 2019-05-20 DIAGNOSIS — L89150 Pressure ulcer of sacral region, unstageable: Secondary | ICD-10-CM | POA: Diagnosis not present

## 2019-05-20 DIAGNOSIS — R54 Age-related physical debility: Secondary | ICD-10-CM | POA: Diagnosis not present

## 2019-05-20 DIAGNOSIS — R627 Adult failure to thrive: Secondary | ICD-10-CM | POA: Diagnosis not present

## 2019-05-20 DIAGNOSIS — I82621 Acute embolism and thrombosis of deep veins of right upper extremity: Secondary | ICD-10-CM | POA: Diagnosis not present

## 2019-05-20 DIAGNOSIS — L89140 Pressure ulcer of left lower back, unstageable: Secondary | ICD-10-CM | POA: Diagnosis not present

## 2019-05-20 DIAGNOSIS — E43 Unspecified severe protein-calorie malnutrition: Secondary | ICD-10-CM | POA: Diagnosis not present

## 2019-05-21 ENCOUNTER — Other Ambulatory Visit: Payer: Self-pay | Admitting: *Deleted

## 2019-05-21 DIAGNOSIS — L89154 Pressure ulcer of sacral region, stage 4: Secondary | ICD-10-CM | POA: Diagnosis not present

## 2019-05-21 DIAGNOSIS — L89120 Pressure ulcer of left upper back, unstageable: Secondary | ICD-10-CM | POA: Diagnosis not present

## 2019-05-21 DIAGNOSIS — L97829 Non-pressure chronic ulcer of other part of left lower leg with unspecified severity: Secondary | ICD-10-CM | POA: Diagnosis not present

## 2019-05-21 DIAGNOSIS — L89144 Pressure ulcer of left lower back, stage 4: Secondary | ICD-10-CM | POA: Diagnosis not present

## 2019-05-21 NOTE — Patient Outreach (Signed)
Screened for potential [facility] Care Management needs as a benefit of  [employer].  Mr. Adonis Huguenin is currently receiving skilled nursing care at [facility] SN[facility].   Writer attended telephonic interdisciplinary team meeting earlier today to assess for disposition needs and transition plan for resident.   Facility reports therapy has ended and member is being skilled for nursing wound care. Palliative referral is pending.   Will plan outreach to family to discuss disposition plans. Member could benefit from hospice services.  Raiford Noble, MSN-Ed, RN,BSN [facility] Post Acute Care Coordinator [phone] [facility]) [phone]  (Toll free office)

## 2019-05-22 ENCOUNTER — Non-Acute Institutional Stay: Payer: Medicare Other | Admitting: Licensed Clinical Social Worker

## 2019-05-22 DIAGNOSIS — L89610 Pressure ulcer of right heel, unstageable: Secondary | ICD-10-CM | POA: Diagnosis not present

## 2019-05-22 DIAGNOSIS — Z515 Encounter for palliative care: Secondary | ICD-10-CM

## 2019-05-22 DIAGNOSIS — M6281 Muscle weakness (generalized): Secondary | ICD-10-CM | POA: Diagnosis not present

## 2019-05-22 DIAGNOSIS — I82621 Acute embolism and thrombosis of deep veins of right upper extremity: Secondary | ICD-10-CM | POA: Diagnosis not present

## 2019-05-22 DIAGNOSIS — L89620 Pressure ulcer of left heel, unstageable: Secondary | ICD-10-CM | POA: Diagnosis not present

## 2019-05-22 DIAGNOSIS — G8929 Other chronic pain: Secondary | ICD-10-CM | POA: Diagnosis not present

## 2019-05-22 DIAGNOSIS — E43 Unspecified severe protein-calorie malnutrition: Secondary | ICD-10-CM | POA: Diagnosis not present

## 2019-05-22 DIAGNOSIS — R54 Age-related physical debility: Secondary | ICD-10-CM | POA: Diagnosis not present

## 2019-05-22 DIAGNOSIS — L89140 Pressure ulcer of left lower back, unstageable: Secondary | ICD-10-CM | POA: Diagnosis not present

## 2019-05-22 DIAGNOSIS — L89150 Pressure ulcer of sacral region, unstageable: Secondary | ICD-10-CM | POA: Diagnosis not present

## 2019-05-25 ENCOUNTER — Other Ambulatory Visit: Payer: Self-pay

## 2019-05-25 DEATH — deceased

## 2019-06-08 ENCOUNTER — Ambulatory Visit: Payer: Medicare Other | Admitting: Neurology

## 2019-06-22 NOTE — Progress Notes (Signed)
COMMUNITY PALLIATIVE CARE SW NOTE  PATIENT NAME: Vernon MCCAULEY MEO Sr. DOB: [birth year] MRN: [id number]  PRIMARY CARE PROVIDER: Merri Brunette, MD  RESPONSIBLE PARTY:  Acct ID - Guarantor Home Phone Work Phone Relationship Acct Type  [id number] - [name],[name] [phone]  Self P/F     [address], [place], [place] [address]   Due to the COVID-19 crisis, this virtual check-in visit was done via telephone from my office and it was initiated and consent by this patientand orfamily.  PLAN OF CARE and INTERVENTIONS:             1. GOALS OF CARE/ ADVANCE CARE PLANNING:  Goal for son is to obtain additional information about patient at [facility].  Patient was a DNR in the hospital. 2. SOCIAL/EMOTIONAL/SPIRITUAL ASSESSMENT/ INTERVENTIONS:  SW conducted a virtual check-in visit with patient's son, Vernon Walton, Vernon Walton.  He lives in [place] and is concerned about his father's condition.  He expressed frustration in obtaining information from the facility.  "Two months ago he was walking with a walker."  Patient's wife is under Hospice care from [place] with 24/7 caregivers.  Vernon Walton would like to make placement arrangements to have his father and stepmother together if needed.  SW provided active listening and supportive counseling. 3. PATIENT/CAREGIVER EDUCATION/ COPING:  SW provided education regarding the Palliative Care program.  Son copes by problem-solving. 4. PERSONAL EMERGENCY PLAN:  Per facility protocol. 5. COMMUNITY RESOURCES COORDINATION/ HEALTH CARE NAVIGATION:  None. 6. FINANCIAL/LEGAL CONCERNS/INTERVENTIONS:  Son expressed future financial concerns.     SOCIAL HX:  Social History   Tobacco Use  . Smoking status: Never Smoker  . Smokeless tobacco: Never Used  Substance Use Topics  . Alcohol use: Not on file    CODE STATUS:  Prior DNR ADVANCED DIRECTIVES: N MOST FORM COMPLETE:  N HOSPICE EDUCATION PROVIDED:  Y Duration of visit and documentation:  45 minutes.      Vernon Walton  Vernon Latour, LCSW
